# Patient Record
Sex: Female | Born: 1935 | Race: Black or African American | Hispanic: No | Marital: Married | Smoking: Former smoker
Health system: Southern US, Community
[De-identification: ages and names within clinical notes are randomized; demographics above are authoritative.]

## PROBLEM LIST (undated history)

## (undated) DIAGNOSIS — N183 Chronic kidney disease, stage 3 unspecified: Secondary | ICD-10-CM

## (undated) DIAGNOSIS — D649 Anemia, unspecified: Secondary | ICD-10-CM

## (undated) DIAGNOSIS — K3189 Other diseases of stomach and duodenum: Secondary | ICD-10-CM

## (undated) DIAGNOSIS — M1990 Unspecified osteoarthritis, unspecified site: Secondary | ICD-10-CM

## (undated) DIAGNOSIS — C801 Malignant (primary) neoplasm, unspecified: Secondary | ICD-10-CM

## (undated) DIAGNOSIS — H269 Unspecified cataract: Secondary | ICD-10-CM

## (undated) DIAGNOSIS — I10 Essential (primary) hypertension: Secondary | ICD-10-CM

## (undated) DIAGNOSIS — E119 Type 2 diabetes mellitus without complications: Secondary | ICD-10-CM

## (undated) DIAGNOSIS — N289 Disorder of kidney and ureter, unspecified: Secondary | ICD-10-CM

## (undated) HISTORY — DX: Type 2 diabetes mellitus without complications: E11.9

## (undated) HISTORY — DX: Unspecified cataract: H26.9

## (undated) HISTORY — DX: Essential (primary) hypertension: I10

## (undated) HISTORY — DX: Disorder of kidney and ureter, unspecified: N28.9

## (undated) HISTORY — PX: ENDOMETRIAL BIOPSY: SHX622

## (undated) HISTORY — PX: OTHER SURGICAL HISTORY: SHX169

---

## 2014-01-09 HISTORY — PX: EYE SURGERY: SHX253

## 2016-12-04 ENCOUNTER — Ambulatory Visit: Payer: Medicare Other | Attending: Gynecologic Oncology | Admitting: Gynecologic Oncology

## 2016-12-04 ENCOUNTER — Encounter: Payer: Self-pay | Admitting: Gynecologic Oncology

## 2016-12-04 ENCOUNTER — Telehealth: Payer: Self-pay | Admitting: *Deleted

## 2016-12-04 ENCOUNTER — Ambulatory Visit (HOSPITAL_BASED_OUTPATIENT_CLINIC_OR_DEPARTMENT_OTHER): Payer: Medicare Other

## 2016-12-04 VITALS — BP 179/69 | HR 92 | Temp 98.4°F | Resp 20 | Ht 65.35 in | Wt 167.6 lb

## 2016-12-04 DIAGNOSIS — E1122 Type 2 diabetes mellitus with diabetic chronic kidney disease: Secondary | ICD-10-CM

## 2016-12-04 DIAGNOSIS — Z794 Long term (current) use of insulin: Secondary | ICD-10-CM | POA: Diagnosis not present

## 2016-12-04 DIAGNOSIS — K869 Disease of pancreas, unspecified: Secondary | ICD-10-CM | POA: Diagnosis present

## 2016-12-04 DIAGNOSIS — Z87891 Personal history of nicotine dependence: Secondary | ICD-10-CM | POA: Diagnosis not present

## 2016-12-04 DIAGNOSIS — R7989 Other specified abnormal findings of blood chemistry: Secondary | ICD-10-CM | POA: Diagnosis not present

## 2016-12-04 DIAGNOSIS — D689 Coagulation defect, unspecified: Secondary | ICD-10-CM | POA: Diagnosis not present

## 2016-12-04 DIAGNOSIS — E1121 Type 2 diabetes mellitus with diabetic nephropathy: Secondary | ICD-10-CM | POA: Insufficient documentation

## 2016-12-04 DIAGNOSIS — C541 Malignant neoplasm of endometrium: Secondary | ICD-10-CM | POA: Diagnosis not present

## 2016-12-04 DIAGNOSIS — R945 Abnormal results of liver function studies: Secondary | ICD-10-CM

## 2016-12-04 DIAGNOSIS — N289 Disorder of kidney and ureter, unspecified: Secondary | ICD-10-CM | POA: Diagnosis not present

## 2016-12-04 DIAGNOSIS — C55 Malignant neoplasm of uterus, part unspecified: Secondary | ICD-10-CM | POA: Insufficient documentation

## 2016-12-04 DIAGNOSIS — N183 Chronic kidney disease, stage 3 unspecified: Secondary | ICD-10-CM

## 2016-12-04 DIAGNOSIS — R978 Other abnormal tumor markers: Secondary | ICD-10-CM

## 2016-12-04 DIAGNOSIS — R1909 Other intra-abdominal and pelvic swelling, mass and lump: Secondary | ICD-10-CM

## 2016-12-04 DIAGNOSIS — K8689 Other specified diseases of pancreas: Secondary | ICD-10-CM

## 2016-12-04 DIAGNOSIS — I10 Essential (primary) hypertension: Secondary | ICD-10-CM | POA: Diagnosis not present

## 2016-12-04 DIAGNOSIS — Z79899 Other long term (current) drug therapy: Secondary | ICD-10-CM | POA: Diagnosis not present

## 2016-12-04 DIAGNOSIS — K259 Gastric ulcer, unspecified as acute or chronic, without hemorrhage or perforation: Secondary | ICD-10-CM | POA: Insufficient documentation

## 2016-12-04 LAB — CBC & DIFF AND RETIC
BASO%: 0.8 % (ref 0.0–2.0)
Basophils Absolute: 0.1 10*3/uL (ref 0.0–0.1)
EOS%: 2.4 % (ref 0.0–7.0)
Eosinophils Absolute: 0.2 10*3/uL (ref 0.0–0.5)
HCT: 31.1 % — ABNORMAL LOW (ref 34.8–46.6)
HGB: 9.8 g/dL — ABNORMAL LOW (ref 11.6–15.9)
Immature Retic Fract: 8.7 % (ref 1.60–10.00)
LYMPH#: 2.1 10*3/uL (ref 0.9–3.3)
LYMPH%: 30 % (ref 14.0–49.7)
MCH: 27.7 pg (ref 25.1–34.0)
MCHC: 31.5 g/dL (ref 31.5–36.0)
MCV: 87.9 fL (ref 79.5–101.0)
MONO#: 0.7 10*3/uL (ref 0.1–0.9)
MONO%: 9.3 % (ref 0.0–14.0)
NEUT#: 4.1 10*3/uL (ref 1.5–6.5)
NEUT%: 57.5 % (ref 38.4–76.8)
PLATELETS: 393 10*3/uL (ref 145–400)
RBC: 3.54 10*6/uL — AB (ref 3.70–5.45)
RDW: 14.2 % (ref 11.2–14.5)
Retic %: 1.41 % (ref 0.70–2.10)
Retic Ct Abs: 49.91 10*3/uL (ref 33.70–90.70)
WBC: 7.1 10*3/uL (ref 3.9–10.3)

## 2016-12-04 LAB — COMPREHENSIVE METABOLIC PANEL
ALBUMIN: 3.1 g/dL — AB (ref 3.5–5.0)
ALK PHOS: 103 U/L (ref 40–150)
ALT: 12 U/L (ref 0–55)
AST: 17 U/L (ref 5–34)
Anion Gap: 10 mEq/L (ref 3–11)
BUN: 21 mg/dL (ref 7.0–26.0)
CO2: 24 mEq/L (ref 22–29)
Calcium: 10 mg/dL (ref 8.4–10.4)
Chloride: 106 mEq/L (ref 98–109)
Creatinine: 1.7 mg/dL — ABNORMAL HIGH (ref 0.6–1.1)
EGFR: 33 mL/min/{1.73_m2} — AB (ref >60)
GLUCOSE: 173 mg/dL — AB (ref 70–140)
Potassium: 4.6 mEq/L (ref 3.5–5.1)
SODIUM: 140 meq/L (ref 136–145)
TOTAL PROTEIN: 8 g/dL (ref 6.4–8.3)

## 2016-12-04 LAB — PROTIME-INR
INR: 1 — AB (ref 2.00–3.50)
Protime: 12 Seconds (ref 10.6–13.4)

## 2016-12-04 LAB — HEMOGLOBIN A1C
Est. average glucose Bld gHb Est-mCnc: 183 mg/dL
HEMOGLOBIN A1C: 8 % — AB (ref 4.8–5.6)

## 2016-12-04 NOTE — Telephone Encounter (Signed)
Attempted to contact the patient, but was unable to reach her. The phone rang with no answer. Attempted to contact the patient's son, left a message to call the office back. Patient needs to be given appt with Dr. Barry Dienes

## 2016-12-04 NOTE — Progress Notes (Signed)
New Patient Note: Gyn-Onc  New Patient evaluation of Martha Mcdaniel 83 y.o. female  CC:  Chief Complaint  Patient presents with  . Carcinosarcoma of endometrium [facility])    Assessment/Plan:  Martha. Martha Mcdaniel  is a 83 y.o.  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  She has poorly controlled diabetes mellitus with associated nephropathy and neuropathy. She has unexplained elevated LFT's and coag dysfunction.  Her CT scan report does not show apparent disseminated disease.  I discussed with the patient and her son and daughter that she requires surgery for her uterine cancer. I would recommend an ex lap, TAH, BSO, lymphadenectomy because the uterus is too large to be extracted via a minimally invasive route (though if attempted minimally invasively she would need a minilap for specimen delivery).  She also requires an exploration of the upper abdomen and possible distal pancreatectomy, partial gastrectomy. I discussed that we would facilitate referral to Dr Barry Dienes from [facility] and coordinate a surgical time for [place] to remove these masses.  In the meantime we will order upper abdominal imaging to better characterize the upper abdominal mass. We will submit her films from [place] for [facility] review and to be available for surgical planning. We will submit her pathology slides for [facility] pathology review.  With respect to her underlying medical comorbidities, particularly poorly controlled DM, we will repeat labs today including HbA1C, cbc, coags and cmet.  She has been instructed to see her PCP in [facility] for optimization as she is a poor surgical candidate for such a radical procedure at her age with poorly controlled diabetes and questionable renal and hepatic function. She may require to see an internist here at [facility] or an endocrinologist to establish  optimization.   Surgery will be coordinated with Dr Barry Dienes for the new year in order to faciliate this work up.   HPI: Martha Mcdaniel is an 83 year old P3 who is seen for a new patient consultation for uterine carcinosarcoma.  The patient is a resident of the [place] (though her daughter lives in [place], [place] where she spends half her year). The patient developed some vaginal bleeding in October, 2018 and was admitted to a hospital in [place].  CT scan of the abdomen and pelvis on 10/25/16 showed normal lung bases, normal liver and spleen. A 3.5x3.5x4.9cm soft tissue mass was seen abutting the distal pancreas and arising from the fundus of the stomach "suggestive of gastroinstinal stromal tumor".  There was a cystic 4cm lesion in the right kidney (which on separate [place] appeared benign). The Uterus was markedly enlarged with a thickened endometrium at 3.7cm.  Endometrial biopsy on10/22/18 showed uterine carcinosarcoma. Pelvic US on 10/25/16 showed a uterus measuring 13x8.5x6.7cm with an endometrium that measured 3.6cm.  EGD on 10/26/16 showed a gastric ulcer which was biopsied and found to be benign.   Lab work from that hospitalization showed a WBC elevated to 15,000, with a hemoglobin of 10.2. Platelets were normal.  The white count normalized throughout there stay. But the Hb decreased to 8.3. On 10/25/16 her PTT was elevated at 15, and INR was 1.6 (the patient denies being treated with anticoagulants). Her creatinine was 1.9 and her glucose 219.  ALT was mildly elevated at 57.  HbA1C was very elevated at 9.8 on 0/18/18. She has poorly controlled diabetes and admits to being noncompliant  with meds. Her insulin was adjusted during that hospital stay.     Interval History: She continues to have vaginal bleeding but denies upper abdominal symptoms.  Current Meds:  Outpatient Encounter Medications as of 12/04/2016  Medication Sig  . insulin detemir (LEVEMIR) 100 UNIT/ML  injection Inject 25 Units into the skin at bedtime.  . insulin NPH-regular Human (NOVOLIN 70/30) (70-30) 100 UNIT/ML injection Inject 20 Units into the skin every morning.  Marland Kitchen lisinopril (PRINIVIL,ZESTRIL) 20 MG tablet Take 20 mg by mouth daily.  . pantoprazole (PROTONIX) 40 MG tablet Take 40 mg by mouth 2 (two) times daily.  . sitaGLIPtin (JANUVIA) 50 MG tablet Take 50 mg by mouth daily.  Marland Kitchen terbinafine (LAMISIL) 250 MG tablet Take 250 mg by mouth daily.   No facility-administered encounter medications on file as of 12/04/2016.     Allergy:  Allergies  Allergen Reactions  . Ibuprofen Other (See Comments)    Pt feels disoriented with medication     Social Hx:   Social History   Socioeconomic History  . Marital status: Married    Spouse name: Not on file  . Number of children: Not on file  . Years of education: Not on file  . Highest education level: Not on file  Social Needs  . Financial resource strain: Not on file  . Food insecurity - worry: Not on file  . Food insecurity - inability: Not on file  . Transportation needs - medical: Not on file  . Transportation needs - non-medical: Not on file  Occupational History  . Not on file  Tobacco Use  . Smoking status: Former Smoker    Packs/day: 1.00    Types: Cigarettes  . Smokeless tobacco: Never Used  Substance and Sexual Activity  . Alcohol use: No    Frequency: Never    Comment: Sober quit drinking  . Drug use: No  . Sexual activity: Not on file  Other Topics Concern  . Not on file  Social History Narrative  . Not on file    Past Surgical Hx: History reviewed. No pertinent surgical history.  Past Medical Hx:  Past Medical History:  Diagnosis Date  . Cataract   . Diabetes mellitus without complication ([place])   . Hypertension   . Renal insufficiency     Past Gynecological History:  SVD x 3 No LMP recorded.  Family Hx: History reviewed. No pertinent family history.  Review of Systems:  Constitutional   Feels well,    ENT Normal appearing ears and nares bilaterally Skin/Breast  No rash, sores, jaundice, itching, dryness Cardiovascular  No chest pain, shortness of breath, or edema  Pulmonary  No cough or wheeze.  Gastro Intestinal  No nausea, vomitting, or diarrhoea. No bright red blood per rectum, no abdominal pain, change in bowel movement, or constipation.  Genito Urinary  No frequency, urgency, dysuria, + vaginal bleeding Musculo Skeletal  No myalgia, arthralgia, joint swelling or pain  Neurologic  No weakness, numbness, change in gait,  Psychology  No depression, anxiety, insomnia.   Vitals:  Blood pressure (!) 179/69, pulse 92, temperature 98.4 F (36.9 C), temperature source Oral, resp. rate 20, height 5' 5.35" (1.66 m), weight 167 lb 9.6 oz (76 kg), SpO2 100 %.  Physical Exam: WD in NAD Neck  Supple NROM, without any enlargements.  Lymph Node Survey No cervical supraclavicular or inguinal adenopathy Cardiovascular  Pulse normal rate, regularity and rhythm. S1 and S2 normal.  Lungs  Clear to auscultation bilateraly, without  wheezes/crackles/rhonchi. Good air movement.  Skin  No rash/lesions/breakdown  Psychiatry  Alert and oriented to person, place, and time  Abdomen  Normoactive bowel sounds, abdomen soft, non-tender and obese without evidence of hernia.  Back No CVA tenderness Genito Urinary  Vulva/vagina: Normal external female genitalia.  No lesions. No discharge or bleeding.  Bladder/urethra:  No lesions or masses, well supported bladder  Vagina: large necrotic tumor (6cm) protruding and prolapsing through dilated cervix into vagina.  Cervix: dilated, soft, with prolapsing tumor.  Uterus: bulky, mobile   Adnexa: no discrete masses. Rectal  Good tone, no masses no cul de sac nodularity.  Extremities  No bilateral cyanosis, clubbing or edema.   [name], MD  12/04/2016, 5:16 PM

## 2016-12-04 NOTE — Patient Instructions (Addendum)
Plan on having lab work today.  We will also arrange for you to meet with Dr. Barry Dienes with [facility].  The plan will be for a joint procedure with Dr. Barry Dienes and Dr. Denman George in the new year.  With Dr. Denman George, you would be having an exploratory laparotomy, total abdominal hysterectomy, bilateral salpingo-oophorectomy, lymphadenectomy.  We will see you in the office prior to the procedure to discuss procedure in detail.  Plan on making an appointment to meet with your primary care provider about obtaining medical clearance to proceed with surgery.  If needed, we can arrange for you to meet with an endocrinologist in [place].

## 2016-12-05 ENCOUNTER — Telehealth: Payer: Self-pay | Admitting: *Deleted

## 2016-12-05 LAB — CA 125: CANCER ANTIGEN (CA) 125: 82.1 U/mL — AB (ref 0.0–38.1)

## 2016-12-05 NOTE — Telephone Encounter (Signed)
Son returned call and was given the date/time for the appt with Dr.Byerly on Dec 10th at 10:45am; arrive at 10:15am. Gave the address and phone number

## 2016-12-19 ENCOUNTER — Other Ambulatory Visit: Payer: Self-pay | Admitting: General Surgery

## 2016-12-25 ENCOUNTER — Telehealth: Payer: Self-pay | Admitting: Gynecologic Oncology

## 2016-12-25 NOTE — Telephone Encounter (Signed)
Called and spoke with referrals coordinator at [facility].  Following up on Dr. Marlowe Aschoff note stating that the patient would be referred to GI.  Referrals coordinator stated she would follow up and they would place the referral.

## 2016-12-26 ENCOUNTER — Other Ambulatory Visit: Payer: Self-pay

## 2016-12-26 ENCOUNTER — Telehealth: Payer: Self-pay

## 2016-12-26 DIAGNOSIS — R1902 Left upper quadrant abdominal swelling, mass and lump: Secondary | ICD-10-CM

## 2016-12-26 NOTE — Telephone Encounter (Signed)
01/04/17 EUS 115 pm message left with [facility]. EUS scheduled, pt instructed and medications reviewed.  Patient instructions mailed to home.  Patient to call with any questions or concerns.

## 2016-12-26 NOTE — Plan of Care (Signed)
83 year old female recently diagnosed with carcinosarcoma of the uterus and pancreatic mass presented with complaints of increasing vaginal bleeding at [facility] ER.  Patient was febrile but not having sepsis features.  ER physician did a CT scan of the abdomen and pelvis which showed air in the uterine cavity concerning for infection.  Patient's blood sugar was in the around 380s.  Not in DKA.  ER physician had discussed with Dr. Everitt Amber who advised hospitalist admission and they will be seeing patient in consult.  Patient was started on Invanz and Flagyl and fluids.  Gean Birchwood.

## 2016-12-27 ENCOUNTER — Inpatient Hospital Stay (HOSPITAL_COMMUNITY): Payer: Medicare Other

## 2016-12-27 ENCOUNTER — Other Ambulatory Visit: Payer: Self-pay

## 2016-12-27 ENCOUNTER — Inpatient Hospital Stay (HOSPITAL_COMMUNITY)
Admission: AD | Admit: 2016-12-27 | Discharge: 2016-12-31 | DRG: 872 | Disposition: A | Discharge status: Home / Self Care | Payer: Medicare Other | Source: Other Acute Inpatient Hospital | Attending: Nephrology | Admitting: Nephrology

## 2016-12-27 ENCOUNTER — Encounter (HOSPITAL_COMMUNITY): Payer: Self-pay | Admitting: *Deleted

## 2016-12-27 DIAGNOSIS — I129 Hypertensive chronic kidney disease with stage 1 through stage 4 chronic kidney disease, or unspecified chronic kidney disease: Secondary | ICD-10-CM | POA: Diagnosis present

## 2016-12-27 DIAGNOSIS — N183 Chronic kidney disease, stage 3 unspecified: Secondary | ICD-10-CM | POA: Diagnosis present

## 2016-12-27 DIAGNOSIS — R1909 Other intra-abdominal and pelvic swelling, mass and lump: Secondary | ICD-10-CM | POA: Diagnosis not present

## 2016-12-27 DIAGNOSIS — N939 Abnormal uterine and vaginal bleeding, unspecified: Secondary | ICD-10-CM | POA: Diagnosis present

## 2016-12-27 DIAGNOSIS — N1330 Unspecified hydronephrosis: Secondary | ICD-10-CM | POA: Diagnosis present

## 2016-12-27 DIAGNOSIS — C49A2 Gastrointestinal stromal tumor of stomach: Secondary | ICD-10-CM | POA: Diagnosis present

## 2016-12-27 DIAGNOSIS — C55 Malignant neoplasm of uterus, part unspecified: Secondary | ICD-10-CM | POA: Diagnosis present

## 2016-12-27 DIAGNOSIS — E669 Obesity, unspecified: Secondary | ICD-10-CM | POA: Diagnosis present

## 2016-12-27 DIAGNOSIS — A419 Sepsis, unspecified organism: Secondary | ICD-10-CM | POA: Diagnosis present

## 2016-12-27 DIAGNOSIS — Z6827 Body mass index (BMI) 27.0-27.9, adult: Secondary | ICD-10-CM | POA: Diagnosis not present

## 2016-12-27 DIAGNOSIS — E1165 Type 2 diabetes mellitus with hyperglycemia: Secondary | ICD-10-CM

## 2016-12-27 DIAGNOSIS — N710 Acute inflammatory disease of uterus: Secondary | ICD-10-CM | POA: Diagnosis present

## 2016-12-27 DIAGNOSIS — Z9114 Patient's other noncompliance with medication regimen: Secondary | ICD-10-CM | POA: Diagnosis not present

## 2016-12-27 DIAGNOSIS — K259 Gastric ulcer, unspecified as acute or chronic, without hemorrhage or perforation: Secondary | ICD-10-CM | POA: Diagnosis present

## 2016-12-27 DIAGNOSIS — E785 Hyperlipidemia, unspecified: Secondary | ICD-10-CM | POA: Diagnosis present

## 2016-12-27 DIAGNOSIS — Z794 Long term (current) use of insulin: Secondary | ICD-10-CM

## 2016-12-27 DIAGNOSIS — E1122 Type 2 diabetes mellitus with diabetic chronic kidney disease: Secondary | ICD-10-CM | POA: Diagnosis present

## 2016-12-27 DIAGNOSIS — K219 Gastro-esophageal reflux disease without esophagitis: Secondary | ICD-10-CM | POA: Diagnosis present

## 2016-12-27 DIAGNOSIS — D509 Iron deficiency anemia, unspecified: Secondary | ICD-10-CM | POA: Diagnosis present

## 2016-12-27 DIAGNOSIS — Z87891 Personal history of nicotine dependence: Secondary | ICD-10-CM | POA: Diagnosis not present

## 2016-12-27 DIAGNOSIS — R1900 Intra-abdominal and pelvic swelling, mass and lump, unspecified site: Secondary | ICD-10-CM

## 2016-12-27 DIAGNOSIS — R6510 Systemic inflammatory response syndrome (SIRS) of non-infectious origin without acute organ dysfunction: Secondary | ICD-10-CM | POA: Diagnosis not present

## 2016-12-27 DIAGNOSIS — E1129 Type 2 diabetes mellitus with other diabetic kidney complication: Secondary | ICD-10-CM | POA: Diagnosis present

## 2016-12-27 DIAGNOSIS — I10 Essential (primary) hypertension: Secondary | ICD-10-CM

## 2016-12-27 DIAGNOSIS — C541 Malignant neoplasm of endometrium: Secondary | ICD-10-CM | POA: Diagnosis present

## 2016-12-27 DIAGNOSIS — E1121 Type 2 diabetes mellitus with diabetic nephropathy: Secondary | ICD-10-CM | POA: Diagnosis present

## 2016-12-27 DIAGNOSIS — D638 Anemia in other chronic diseases classified elsewhere: Secondary | ICD-10-CM | POA: Diagnosis present

## 2016-12-27 HISTORY — DX: Chronic kidney disease, stage 3 unspecified: N18.30

## 2016-12-27 HISTORY — DX: Chronic kidney disease, stage 3 (moderate): N18.3

## 2016-12-27 LAB — GLUCOSE, CAPILLARY
Glucose-Capillary: 237 mg/dL — ABNORMAL HIGH (ref 65–99)
Glucose-Capillary: 190 mg/dL — ABNORMAL HIGH (ref 65–99)

## 2016-12-27 LAB — CREATININE, SERUM
CREATININE: 1.57 mg/dL — AB (ref 0.44–1.00)
GFR calc Af Amer: 35 mL/min — ABNORMAL LOW (ref >60)
GFR, EST NON AFRICAN AMERICAN: 30 mL/min — AB (ref >60)

## 2016-12-27 LAB — APTT: aPTT: 29 seconds (ref 24–36)

## 2016-12-27 LAB — PROTIME-INR
INR: 1.09
PROTHROMBIN TIME: 14.1 s (ref 11.4–15.2)

## 2016-12-27 LAB — LACTIC ACID, PLASMA
LACTIC ACID, VENOUS: 1.3 mmol/L (ref 0.5–1.9)
LACTIC ACID, VENOUS: 1.4 mmol/L (ref 0.5–1.9)

## 2016-12-27 LAB — PROCALCITONIN: PROCALCITONIN: 5.09 ng/mL

## 2016-12-27 LAB — ABO/RH: ABO/RH(D): B POS

## 2016-12-27 MED ORDER — SIMVASTATIN 20 MG PO TABS
20.0000 mg | ORAL_TABLET | Freq: Every day | ORAL | Status: DC
  Administered 2016-12-27 – 2016-12-30 (×4): 20 mg via ORAL
  Filled 2016-12-27 (×4): qty 1

## 2016-12-27 MED ORDER — INSULIN ASPART 100 UNIT/ML ~~LOC~~ SOLN
0.0000 [IU] | Freq: Three times a day (TID) | SUBCUTANEOUS | Status: DC
  Administered 2016-12-27: 3 [IU] via SUBCUTANEOUS
  Administered 2016-12-28: 2 [IU] via SUBCUTANEOUS
  Administered 2016-12-28: 1 [IU] via SUBCUTANEOUS
  Administered 2016-12-29 (×2): 2 [IU] via SUBCUTANEOUS

## 2016-12-27 MED ORDER — PANTOPRAZOLE SODIUM 40 MG PO TBEC
40.0000 mg | DELAYED_RELEASE_TABLET | Freq: Two times a day (BID) | ORAL | Status: DC
  Administered 2016-12-27 – 2016-12-31 (×8): 40 mg via ORAL
  Filled 2016-12-27 (×8): qty 1

## 2016-12-27 MED ORDER — ENOXAPARIN SODIUM 30 MG/0.3ML ~~LOC~~ SOLN
30.0000 mg | SUBCUTANEOUS | Status: DC
  Administered 2016-12-28: 30 mg via SUBCUTANEOUS
  Filled 2016-12-27: qty 0.3

## 2016-12-27 MED ORDER — DEXTROSE 5 % IV SOLN
1.0000 g | Freq: Two times a day (BID) | INTRAVENOUS | Status: DC
  Filled 2016-12-27: qty 1

## 2016-12-27 MED ORDER — OXYCODONE-ACETAMINOPHEN 5-325 MG PO TABS
1.0000 | ORAL_TABLET | ORAL | Status: DC | PRN
  Administered 2016-12-28 – 2016-12-31 (×6): 1 via ORAL
  Filled 2016-12-27 (×7): qty 1

## 2016-12-27 MED ORDER — MORPHINE SULFATE (PF) 4 MG/ML IV SOLN: 0.5000 mg | INTRAVENOUS | Status: DC | PRN

## 2016-12-27 MED ORDER — ENOXAPARIN SODIUM 40 MG/0.4ML ~~LOC~~ SOLN
40.0000 mg | SUBCUTANEOUS | Status: DC
  Administered 2016-12-27: 40 mg via SUBCUTANEOUS
  Filled 2016-12-27: qty 0.4

## 2016-12-27 MED ORDER — LISINOPRIL 20 MG PO TABS
20.0000 mg | ORAL_TABLET | Freq: Every day | ORAL | Status: DC
  Administered 2016-12-27 – 2016-12-31 (×5): 20 mg via ORAL
  Filled 2016-12-27 (×5): qty 1

## 2016-12-27 MED ORDER — SODIUM CHLORIDE 0.9 % IV BOLUS (SEPSIS)
1500.0000 mL | Freq: Once | INTRAVENOUS | Status: AC
  Administered 2016-12-27: 1500 mL via INTRAVENOUS

## 2016-12-27 MED ORDER — SODIUM CHLORIDE 0.9 % IV SOLN
INTRAVENOUS | Status: DC
  Administered 2016-12-27 – 2016-12-29 (×2): via INTRAVENOUS

## 2016-12-27 MED ORDER — ONDANSETRON HCL 4 MG/2ML IJ SOLN: 4.0000 mg | Freq: Three times a day (TID) | INTRAMUSCULAR | Status: DC | PRN

## 2016-12-27 MED ORDER — HYDRALAZINE HCL 20 MG/ML IJ SOLN: 5.0000 mg | INTRAMUSCULAR | Status: DC | PRN

## 2016-12-27 MED ORDER — PIPERACILLIN-TAZOBACTAM 3.375 G IVPB
3.3750 g | Freq: Once | INTRAVENOUS | Status: AC
  Administered 2016-12-27: 3.375 g via INTRAVENOUS
  Filled 2016-12-27: qty 50

## 2016-12-27 MED ORDER — ZOLPIDEM TARTRATE 5 MG PO TABS: 5.0000 mg | ORAL_TABLET | Freq: Every evening | ORAL | Status: DC | PRN

## 2016-12-27 MED ORDER — INSULIN DETEMIR 100 UNIT/ML ~~LOC~~ SOLN
15.0000 [IU] | Freq: Every day | SUBCUTANEOUS | Status: DC
  Administered 2016-12-27 – 2016-12-30 (×4): 15 [IU] via SUBCUTANEOUS
  Filled 2016-12-27 (×5): qty 0.15

## 2016-12-27 MED ORDER — ACETAMINOPHEN 325 MG PO TABS: 650.0000 mg | ORAL_TABLET | Freq: Four times a day (QID) | ORAL | Status: DC | PRN

## 2016-12-27 MED ORDER — PIPERACILLIN-TAZOBACTAM 3.375 G IVPB
3.3750 g | Freq: Three times a day (TID) | INTRAVENOUS | Status: DC
  Administered 2016-12-28 – 2016-12-31 (×11): 3.375 g via INTRAVENOUS
  Filled 2016-12-27 (×11): qty 50

## 2016-12-27 NOTE — Progress Notes (Signed)
Martha Cross,NP notified of pt admission to [facility] at request of Dr. Blaine Hamper. Stacey Drain

## 2016-12-27 NOTE — Consult Note (Addendum)
Consult Note: Gyn-Onc  Consult was requested by Dr. Blaine Hamper for the evaluation of Martha Mcdaniel 83 y.o. female  CC: Endometritis in setting of carcinosarcoma   Assessment/Plan:  [name]  is a 83 y.o.  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 She has poorly controlled diabetes mellitus with associated nephropathy and neuropathy.  I recommend evaluating with CT abdomen and pelvis to rule out drainable collections.  Continue broad spectrum antibiotics with gram negative and anaerobic coverage.  She requires surgery for her uterine cancer, though it would be safest for Martha Mcdaniel to have this performed when she was not actively infected as surgery in the setting of active pelvic infection is associated with high morbity and mortality. She will need an ex lap, TAH, BSO, lymphadenectomy because the uterus is too large to be extracted via a minimally invasive route (though if attempted minimally invasively she would need a minilap for specimen delivery).  She also requires an exploration of the upper abdomen and possible distal pancreatectomy, partial gastrectomy. She has seen Dr Barry Dienes from [facility] and a coordinated surgical date had been scheduled for January 29th.   In the meantime she is scheduled for endoscopic ultrasound and possible biopsy to evaluate the mass.   Her surgery needed to be delayed due to her underlying medical comorbidities, particularly poorly controlled DM. She has been instructed to see her PCP in [facility] for optimization as she is a poor surgical candidate for such a radical procedure at her age with poorly controlled diabetes and questionable renal and hepatic function. She may require to see an internist here at [facility] or an  endocrinologist to establish optimization.   HPI: [name] is an 83 year old P3 who was seen for a new patient consultation for uterine carcinosarcoma by me on 12/04/16.  The patient is a resident of the [place] (though her daughter lives in [place], [place] where she spends half her year). The patient developed some vaginal bleeding in October, 2018 and was admitted to a hospital in [place].  CT scan of the abdomen and pelvis on 10/25/16 showed normal lung bases, normal liver and spleen. A 3.5x3.5x4.9cm soft tissue mass was seen abutting the distal pancreas and arising from the fundus of the stomach "suggestive of gastroinstinal stromal tumor".  There was a cystic 4cm lesion in the right kidney (which on separate [place] appeared benign). The Uterus was markedly enlarged with a thickened endometrium at 3.7cm.  Endometrial biopsy on10/22/18 showed uterine carcinosarcoma. Pelvic US on 10/25/16 showed a uterus measuring 13x8.5x6.7cm with an endometrium that measured 3.6cm.  EGD on 10/26/16 showed a gastric ulcer which was biopsied and found to be benign.   Lab work from that hospitalization showed a WBC elevated to 15,000, with a hemoglobin of 10.2. Platelets were normal.  The white count normalized throughout there stay. But the Hb decreased to 8.3. On 10/25/16 her PTT was elevated at 15, and INR was 1.6 (the patient denies being treated with anticoagulants). Her creatinine was 1.9 and her glucose 219.  ALT was mildly elevated at 57.  HbA1C was very elevated at 9.8 on 10/26/16. She has poorly controlled diabetes and admits to being noncompliant with meds. Her insulin was adjusted during that hospital stay.  Repeat labs here at [facility] on  12/04/16 were overall improved and showed a low albumin at 3.1 and elevated creatinine at 1.7. Her Hb was low (anemia) at 9.8 - likely secondary to bleeding from the tumor and anemia of chronic disease. Her LFT's were normal, as were her  coags.  Her Hb A1 C was elevated at 8%.  Her CA 125 tumor marker was mildly elevated at 82.   Interval History: Surgery was coordinated for January, 29th, 2019 as a combined procedure with Dr Barry Dienes for resection of the upper GI tumor with a planned preoperative EUS study to better delineate the mass.  In the intervening period she developed symptoms of abdominal pain and fevers and presented to the local ER in [facility] where she was noted to have a fever of 102. IV antibiotics were started and she was transferred to [facility] approximately 30 hours later.   At the time of admission she was afebrile (70F), mildly tachycardic (105) and with otherwise normal vital signs.   Current Meds:  Current Facility-Administered Medications:  .  0.9 %  sodium chloride infusion, , Intravenous, Continuous, Ivor Costa, MD .  acetaminophen (TYLENOL) tablet 650 mg, 650 mg, Oral, Q6H PRN, Ivor Costa, MD .  cefoTEtan (CEFOTAN) 1 g in dextrose 5 % 50 mL IVPB, 1 g, Intravenous, Q12H, Ivor Costa, MD .  hydrALAZINE (APRESOLINE) injection 5 mg, 5 mg, Intravenous, Q2H PRN, Ivor Costa, MD .  insulin detemir (LEVEMIR) injection 15 Units, 15 Units, Subcutaneous, QHS, Ivor Costa, MD .  lisinopril (PRINIVIL,ZESTRIL) tablet 20 mg, 20 mg, Oral, Daily, Ivor Costa, MD .  morphine 4 MG/ML injection 0.52 mg, 0.52 mg, Intravenous, Q4H PRN, Ivor Costa, MD .  ondansetron (ZOFRAN) injection 4 mg, 4 mg, Intravenous, Q8H PRN, Ivor Costa, MD .  oxyCODONE-acetaminophen (PERCOCET/ROXICET) 5-325 MG per tablet 1 tablet, 1 tablet, Oral, Q4H PRN, Ivor Costa, MD .  pantoprazole (PROTONIX) EC tablet 40 mg, 40 mg, Oral, BID, Ivor Costa, MD .  simvastatin (ZOCOR) tablet 20 mg, 20 mg, Oral, q1800, Ivor Costa, MD .  sodium chloride 0.9 % bolus 1,500 mL, 1,500 mL, Intravenous, Once, Ivor Costa, MD .  zolpidem (AMBIEN) tablet 5 mg, 5 mg, Oral, QHS PRN, Ivor Costa, MD   Allergy:  Allergies  Allergen Reactions  . Ibuprofen  Other (See Comments)    Pt feels disoriented with medication     Social Hx:   Social History   Socioeconomic History  . Marital status: Married    Spouse name: Not on file  . Number of children: Not on file  . Years of education: Not on file  . Highest education level: Not on file  Social Needs  . Financial resource strain: Not on file  . Food insecurity - worry: Not on file  . Food insecurity - inability: Not on file  . Transportation needs - medical: Not on file  . Transportation needs - non-medical: Not on file  Occupational History  . Not on file  Tobacco Use  . Smoking status: Former Smoker    Packs/day: 1.00    Types: Cigarettes  . Smokeless tobacco: Never Used  Substance and Sexual Activity  . Alcohol use: No    Frequency: Never    Comment: Sober quit drinking  . Drug use: No  . Sexual activity: No  Other Topics Concern  . Not on file  Social History Narrative  . Not on file    Past Surgical Hx: History reviewed. No pertinent surgical history.  Past Medical Hx:  Past Medical History:  Diagnosis Date  . Cataract   . CKD (chronic kidney disease), stage III ([place])   . Diabetes mellitus without complication ([place])   . Hypertension   . Renal insufficiency     Past Gynecological History:  Carcinosarcoma of the uterus. No LMP recorded.  Family Hx: History reviewed. No pertinent family history.  Review of Systems:  Constitutional  Feels well,   ENT Normal appearing ears and nares bilaterally Skin/Breast  No rash, sores, jaundice, itching, dryness Cardiovascular  No chest pain, shortness of breath, or edema  Pulmonary  No cough or wheeze.  Gastro Intestinal  No nausea, vomitting, or diarrhoea. No bright red blood per rectum, no abdominal pain, change in bowel movement, or constipation.  Genito Urinary  No frequency, urgency, dysuria, + bleeding (stable, light) Musculo Skeletal  No myalgia, arthralgia, joint swelling or pain  Neurologic  No  weakness, numbness, change in gait,  Psychology  No depression, anxiety, insomnia.   Vitals:  Blood pressure 137/63, pulse (!) 105, temperature 99 F (37.2 C), temperature source Oral, resp. rate 19, height 5\' 6"  (1.676 m), weight 169 lb 12.1 oz (77 kg).  Physical Exam: WD in NAD Neck  Supple NROM, without any enlargements.  Lymph Node Survey No cervical supraclavicular or inguinal adenopathy Cardiovascular  Pulse normal rate, regularity and rhythm. S1 and S2 normal.  Lungs  Clear to auscultation bilateraly, without wheezes/crackles/rhonchi. Good air movement.  Skin  No rash/lesions/breakdown  Psychiatry  Alert and oriented to person, place, and time  Abdomen  Normoactive bowel sounds, abdomen soft, snd obese without evidence of hernia. Tender uterine fundus to palpate. Back No CVA tenderness Genito Urinary  Vulva/vagina: Normal external female genitalia.  No lesions. No discharge or bleeding.  Bladder/urethra:  No lesions or masses, well supported bladder  Vagina: normal, no lesions, blood (brown) in vault  Cervix: dilated around 6cm uterine prolapsing mass  Uterus: Globularly enlarged to umbilicus, tender  Rectal  deferred.  Extremities  No bilateral cyanosis, clubbing or edema.  Plan discussed with Dr Blaine Hamper. Attempted to contact patient's daughter, however patient unaware of her number and not listed in [place].  [name], MD  12/27/2016, 5:30 PM  Cell: [phone]

## 2016-12-27 NOTE — H&P (Addendum)
History and Physical    Martha Mcdaniel XTG:[id number] DOB: Apr 06, 1933 DOA: 12/27/2016  Referring MD/NP/PA:   PCP: System, Pcp Not In   Patient coming from:  The patient is coming from home.  At baseline, pt is independent for most of ADL.  Chief Complaint: Abdominal pain, vaginal bleeding, fever and chills.  HPI: Martha Mcdaniel is a 83 y.o. female with medical history significant of hypertension, hyperlipidemia, diabetes mellitus, GERD, CKD-3, who presents with abdominal pain, vaginal bleeding fever and chills.  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 She continues to have lower abdominal pain, which is constant, moderate, nonradiating.  He developed fever and chills in the past 3 days. Patient was admitted to [facility] initially, and was concerning for endometrial infection. She is transferred to [facility] for further evaluation and treatment.  Currently patient has low abdominal pain, but denies nausea vomiting or diarrhea.  Also denies chest pain, shortness breath, symptoms of UTI or unilateral weakness.  Patient was a started with 2 antibiotics, Invanz and Flagyl.   Pt was found to have WBC 14.5, stable renal function, temperature 99, tachycardia, lactic acid 1.2, INR 1.21, negative troponin, negative urinalysis, CK 86.  Patient is admitted to telemetry bed as inpatient.  OB/GYN, Dr. Everitt Amber was consulted.  Review of Systems:   General: has fevers, chills,  has poor appetite, has fatigue HEENT: no blurry vision, hearing changes or sore throat Respiratory: no dyspnea, coughing, wheezing CV: no chest pain, no palpitations GI: no nausea, vomiting, has abdominal pain, no diarrhea, constipation GU: no dysuria, burning on urination, increased urinary frequency, hematuria  Ext: no leg edema Neuro: no unilateral weakness, numbness, or tingling, no vision change or hearing  loss Skin: no rash, no skin tear. MSK: No muscle spasm, no deformity, no limitation of range of movement in spin Heme: No easy bruising.  Travel history: No recent long distant travel.  Allergy:  Allergies  Allergen Reactions  . Ibuprofen Other (See Comments)    Pt feels disoriented with medication     Past Medical History:  Diagnosis Date  . Cataract   . CKD (chronic kidney disease), stage III ([place])   . Diabetes mellitus without complication ([place])   . Hypertension   . Renal insufficiency     Past Surgical History:  Procedure Laterality Date  . ENDOMETRIAL BIOPSY    . Gastric ulcer biopsy      Social History:  reports that she has quit smoking. Her smoking use included cigarettes. She smoked 1.00 pack per day. she has never used smokeless tobacco. She reports that she does not drink alcohol or use drugs.  Family History:  Family History  Problem Relation Age of Onset  . Headache Mother      Prior to Admission medications   Medication Sig Start Date End Date Taking? Authorizing Provider  acetaminophen (TYLENOL) 325 MG tablet Take 650 mg by mouth every 6 (six) hours as needed for mild pain or moderate pain.   Yes [provider]  atorvastatin (LIPITOR) 20 MG tablet Take 20 mg by mouth daily. 05/24/16  Yes [provider]  diclofenac sodium (VOLTAREN) 1 % GEL Apply 1 application topically 4 (four) times daily as needed for pain. 02/23/16  Yes [provider]  insulin detemir (LEVEMIR) 100 UNIT/ML injection Inject 20 Units into the skin at bedtime.    Yes [provider]  insulin NPH-regular Human (NOVOLIN  70/30) (70-30) 100 UNIT/ML injection Inject 20 Units into the skin every morning.   Yes [provider]  lisinopril (PRINIVIL,ZESTRIL) 20 MG tablet Take 20 mg by mouth daily.   Yes [provider]  simvastatin (ZOCOR) 20 MG tablet Take 20 mg by mouth daily at 6 PM.   Yes [provider]  pantoprazole (PROTONIX)  40 MG tablet Take 40 mg by mouth 2 (two) times daily.    [provider]  sitaGLIPtin (JANUVIA) 50 MG tablet Take 50 mg by mouth daily.    [provider]  terbinafine (LAMISIL) 250 MG tablet Take 250 mg by mouth daily.    [provider]    Physical Exam: Vitals:   12/27/16 1350  BP: 137/63  Pulse: (!) 105  Resp: 19  Temp: 99 F (37.2 C)  TempSrc: Oral  Weight: 77 kg (169 lb 12.1 oz)  Height: 5\' 6"  (1.676 m)   General: Not in acute distress HEENT:       Eyes: PERRL, EOMI, no scleral icterus.       ENT: No discharge from the ears and nose, no pharynx injection, no tonsillar enlargement.        Neck: No JVD, no bruit, no mass felt. Heme: No neck lymph node enlargement. Cardiac: S1/S2, RRR, No murmurs, No gallops or rubs. Respiratory: No rales, wheezing, rhonchi or rubs. GI: Soft, has tenderness in lower abdomen, no rebound pain, no organomegaly, BS present. GU: No hematuria Ext: No pitting leg edema bilaterally. 2+DP/PT pulse bilaterally. Musculoskeletal: No joint deformities, No joint redness or warmth, no limitation of ROM in spin. Skin: No rashes.  Neuro: Alert, oriented X3, cranial nerves II-XII grossly intact, moves all extremities normally. Psych: Patient is not psychotic, no suicidal or hemocidal ideation.  Labs on Admission: I have personally reviewed following labs and imaging studies  CBC: No results for input(s): WBC, NEUTROABS, HGB, HCT, MCV, PLT in the last 168 hours. Basic Metabolic Panel: No results for input(s): NA, K, CL, CO2, GLUCOSE, BUN, CREATININE, CALCIUM, MG, PHOS in the last 168 hours. GFR: CrCl cannot be calculated (Patient's most recent lab result is older than the maximum 21 days allowed.). Liver Function Tests: No results for input(s): AST, ALT, ALKPHOS, BILITOT, PROT, ALBUMIN in the last 168 hours. No results for input(s): LIPASE, AMYLASE in the last 168 hours. No results for input(s): AMMONIA in the last 168  hours. Coagulation Profile: Recent Labs  Lab 12/27/16 1710  INR 1.09   Cardiac Enzymes: No results for input(s): CKTOTAL, CKMB, CKMBINDEX, TROPONINI in the last 168 hours. BNP (last 3 results) No results for input(s): PROBNP in the last 8760 hours. HbA1C: No results for input(s): HGBA1C in the last 72 hours. CBG: No results for input(s): GLUCAP in the last 168 hours. Lipid Profile: No results for input(s): CHOL, HDL, LDLCALC, TRIG, CHOLHDL, LDLDIRECT in the last 72 hours. Thyroid Function Tests: No results for input(s): TSH, T4TOTAL, FREET4, T3FREE, THYROIDAB in the last 72 hours. Anemia Panel: No results for input(s): VITAMINB12, FOLATE, FERRITIN, TIBC, IRON, RETICCTPCT in the last 72 hours. Urine analysis: No results found for: COLORURINE, APPEARANCEUR, LABSPEC, PHURINE, GLUCOSEU, HGBUR, BILIRUBINUR, KETONESUR, PROTEINUR, UROBILINOGEN, NITRITE, LEUKOCYTESUR Sepsis Labs: @LABRCNTIP (procalcitonin:4,lacticidven:4) )No results found for this or any previous visit (from the past 240 hour(s)).   Radiological Exams on Admission: No results found.   EKG:  Not done in ED, will get one.   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  Dr. Everitt Amber of Ob/Gyn recommended to start patient with Zosyn.  Patient meets criteria for sepsis with leukocytosis and tachycardia.  Lactic acid is pending  -will admit to tele bed as inpt -highly appreciated Dr. Serita Grit consultation. -IV zosybn -F/u Bx -will get Procalcitonin and trend lactic acid levels per sepsis  protocol. -IVF: 1.5L of NS bolus in ED, followed by 75 cc/h   Other intra-abdominal and pelvic swelling, mass and lump: per Dr. 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 She recommended CT abdomen and pelvis to rule out drainable collections. Per Dr. Denman George, need to control her infection before any surgery. Per Dr. Denman George, "she will need an ex lap, TAH, BSO, lymphadenectomy because the uterus is too large to be extracted via a minimally invasive route (though if attempted minimally invasively she would need a minilap for specimen delivery). She also requires an exploration of the upper abdomen and possible distal pancreatectomy, partial gastrectomy. She has seen Dr Barry Dienes from [facility] and a coordinated surgical date had been scheduled for January 29th".  -prn Zosyn for nausea, morphine and percoct for pain  HTN: -Lisinopril -IV hydralazine as needed  Type II diabetes mellitus with renal manifestations ([place]): Last A1c 8.0 on 12/04/16, poorly controled. Patient is taking Levemir at home -will decrease Levemir dose from 20-15 units daily. -SSI  Anemia: Hemoglobin 8.5.  Patient has vaginal bleeding, but she is at high risk of developing DVT or PE.  Dr. Denman George recommended to start DVT prophylaxis -f/u CBC q12 -check anemia panel  wGastric ulcer and gerd: Recent gastric ulcer biopsy was benign. -Protonix  CKD-3: Renal function stable.  Baseline creatinine 1.7 on 12/04/16.  Her creatinine is 1.7, BUN 29 today. -Follow-up renal function by BMP   DVT ppx: SQ Lovenox Code Status: Full code Family Communication: None at bed side.    Disposition Plan:  Anticipate discharge back to previous home environment Consults called:  OB/Gyn,  Dr. Everitt Amber Admission status:  Inpatient/tele   Date of Service 12/27/2016    Ivor Costa [facility] Pager [phone]  If 7PM-7AM, please  contact night-coverage [email or web address] Password [email or web address] 12/27/2016, 7:26 PM

## 2016-12-27 NOTE — Progress Notes (Signed)
Pharmacy Antibiotic Note  Martha Mcdaniel is a 83 y.o. female admitted on 12/27/2016 with endometrial infection.  Pharmacy has been consulted for Zosyn dosing.  Plan: Zosyn 3.375g IV q8h (4 hour infusion time).  SCr now. Will f/u if dose needs adjustment.   Height: 5\' 6"  (167.6 cm) Weight: 169 lb 12.1 oz (77 kg) IBW/kg (Calculated) : 59.3  Temp (24hrs), Avg:99 F (37.2 C), Min:99 F (37.2 C), Max:99 F (37.2 C)  Recent Labs  Lab 12/27/16 1710  LATICACIDVEN 1.3    CrCl cannot be calculated (Patient's most recent lab result is older than the maximum 21 days allowed.).    Allergies  Allergen Reactions  . Ibuprofen Other (See Comments)    Pt feels disoriented with medication     Antimicrobials this admission: 12/19 Zosyn >>  Dose adjustments this admission:  Microbiology results: 12/19 BCx:   Thank you for allowing pharmacy to be a part of this patient's care.  Hershal Coria 12/27/2016 6:06 PM

## 2016-12-28 DIAGNOSIS — C541 Malignant neoplasm of endometrium: Secondary | ICD-10-CM

## 2016-12-28 DIAGNOSIS — N183 Chronic kidney disease, stage 3 (moderate): Secondary | ICD-10-CM

## 2016-12-28 DIAGNOSIS — N710 Acute inflammatory disease of uterus: Secondary | ICD-10-CM

## 2016-12-28 LAB — CBC WITH DIFFERENTIAL/PLATELET
BASOS ABS: 0 10*3/uL (ref 0.0–0.1)
Basophils Relative: 0 %
EOS PCT: 1 %
Eosinophils Absolute: 0.1 10*3/uL (ref 0.0–0.7)
HCT: 24 % — ABNORMAL LOW (ref 36.0–46.0)
HEMOGLOBIN: 7.8 g/dL — AB (ref 12.0–15.0)
LYMPHS ABS: 2 10*3/uL (ref 0.7–4.0)
LYMPHS PCT: 18 %
MCH: 28.1 pg (ref 26.0–34.0)
MCHC: 32.5 g/dL (ref 30.0–36.0)
MCV: 86.3 fL (ref 78.0–100.0)
MONO ABS: 1 10*3/uL (ref 0.1–1.0)
MONOS PCT: 9 %
Neutro Abs: 8.2 10*3/uL — ABNORMAL HIGH (ref 1.7–7.7)
Neutrophils Relative %: 72 %
Platelets: 398 10*3/uL (ref 150–400)
RBC: 2.78 MIL/uL — ABNORMAL LOW (ref 3.87–5.11)
RDW: 14.9 % (ref 11.5–15.5)
WBC: 11.3 10*3/uL — AB (ref 4.0–10.5)

## 2016-12-28 LAB — RETICULOCYTES
RBC.: 2.7 MIL/uL — AB (ref 3.87–5.11)
RETIC CT PCT: 0.9 % (ref 0.4–3.1)
Retic Count, Absolute: 24.3 10*3/uL (ref 19.0–186.0)

## 2016-12-28 LAB — IRON AND TIBC
Iron: 10 ug/dL — ABNORMAL LOW (ref 28–170)
SATURATION RATIOS: 7 % — AB (ref 10.4–31.8)
TIBC: 134 ug/dL — AB (ref 250–450)
UIBC: 124 ug/dL

## 2016-12-28 LAB — GLUCOSE, CAPILLARY
GLUCOSE-CAPILLARY: 123 mg/dL — AB (ref 65–99)
GLUCOSE-CAPILLARY: 158 mg/dL — AB (ref 65–99)
GLUCOSE-CAPILLARY: 144 mg/dL — AB (ref 65–99)
Glucose-Capillary: 149 mg/dL — ABNORMAL HIGH (ref 65–99)

## 2016-12-28 LAB — VITAMIN B12: Vitamin B-12: 1020 pg/mL — ABNORMAL HIGH (ref 180–914)

## 2016-12-28 LAB — BASIC METABOLIC PANEL
Anion gap: 5 (ref 5–15)
BUN: 23 mg/dL — AB (ref 6–20)
CHLORIDE: 112 mmol/L — AB (ref 101–111)
CO2: 22 mmol/L (ref 22–32)
CREATININE: 1.64 mg/dL — AB (ref 0.44–1.00)
Calcium: 8.7 mg/dL — ABNORMAL LOW (ref 8.9–10.3)
GFR calc Af Amer: 33 mL/min — ABNORMAL LOW (ref >60)
GFR, EST NON AFRICAN AMERICAN: 28 mL/min — AB (ref >60)
GLUCOSE: 156 mg/dL — AB (ref 65–99)
Potassium: 4.4 mmol/L (ref 3.5–5.1)
SODIUM: 139 mmol/L (ref 135–145)

## 2016-12-28 LAB — FERRITIN: Ferritin: 152 ng/mL (ref 11–307)

## 2016-12-28 LAB — FOLATE: FOLATE: 17.3 ng/mL (ref >5.9)

## 2016-12-28 MED ORDER — ACETAMINOPHEN 325 MG PO TABS
650.0000 mg | ORAL_TABLET | Freq: Four times a day (QID) | ORAL | Status: DC | PRN
  Administered 2016-12-29: 650 mg via ORAL
  Filled 2016-12-28: qty 2

## 2016-12-28 NOTE — Progress Notes (Signed)
PROGRESS NOTE    Martha Mcdaniel  CBJ:[id number] DOB: [birth year] DOA: 12/27/2016 PCP: System, Pcp Not In  Brief Narrative: [name]  is a 83 y.o.  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 Denman George -Continue IV Zosyn and IV fluids today -Plan for exploratory laparoscopy, TAH, BSO and lymphadenectomy along with exploration and possible distal pancreatectomy and partial gastrectomy by Dr. 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 -Plan for partial gastrectomy by surgery in a month  Type 2 diabetes Last hemoglobin A1c was 8 -Continue Levemir, sliding scale insulin  Anemia -Due to chronic blood loss from vaginal bleeding and chronic disease -Monitor hemoglobin, may need iron infusion  Chronic kidney disease stage III -Baseline creatinine around 1.7 -Monitor-  DVT prophylaxis: lovenox Code Status: Full Code Family Communication: no family at bedside   disposition : Home pending clinical improvement    Pocedures:   Antimicrobials:    Subjective: -Feels better today, intermittent mild vaginal bleeding reported, occasional lower abdominal pain which is chronic, no nausea or vomiting   Objective: Vitals:   12/27/16 1350 12/27/16 2114 12/28/16 0529  BP: 137/63 132/67 (!) 139/46  Pulse: (!) 105 (!) 105 72  Resp: 19 16 16   Temp: 99 F (37.2 C) 99.8 F (37.7 C) 99.9 F (37.7 C)  TempSrc: Oral  Oral Oral  SpO2:  99% 96%  Weight: 77 kg (169 lb 12.1 oz)    Height: 5\' 6"  (1.676 m)      Intake/Output Summary (Last 24 hours) at 12/28/2016 1259 Last data filed at 12/28/2016 1034 Gross per 24 hour  Intake 940 ml  Output 950 ml  Net -10 ml   Filed Weights   12/27/16 1350  Weight: 77 kg (169 lb 12.1 oz)    Examination:  General exElderly female chronically ill appearing female Lungs: Decreased breath sounds to bases CVS: S1 & S2 heard, RRR. No JVD, murmurs, rubs, gallops  Gastrointestinal system:abdomen is obese, soft, mildly distended, large pelvic mass noted, bowel sounds present CNS: Alert and oriented. No focal neurological deficits. Extremities: Symmetric 5 x 5 power. Skin: No rashes, lesions or ulcers Psychiatry: Judgement and insight appear normal. Mood & affect appropriate.     Data Reviewed:   CBC: Recent Labs  Lab 12/28/16 0946  WBC 11.3*  NEUTROABS 8.2*  HGB 7.8*  HCT 24.0*  MCV 86.3  PLT 160   Basic Metabolic Panel: Recent Labs  Lab 12/27/16 1916 12/28/16 0515  NA  --  139  K  --  4.4  CL  --  112*  CO2  --  22  GLUCOSE  --  156*  BUN  --  23*  CREATININE 1.57* 1.64*  CALCIUM  --  8.7*   GFR: Estimated Creatinine Clearance: 28.2 mL/min (A) (by C-G formula based on SCr of 1.64 mg/dL (H)). Liver Function Tests: No results for input(s): AST, ALT, ALKPHOS, BILITOT, PROT, ALBUMIN in the last 168 hours. No results for input(s): LIPASE, AMYLASE in the  last 168 hours. No results for input(s): AMMONIA in the last 168 hours. Coagulation Profile: Recent Labs  Lab 12/27/16 1710  INR 1.09   Cardiac Enzymes: No results for input(s): CKTOTAL, CKMB, CKMBINDEX, TROPONINI in the last 168 hours. BNP (last 3 results) No results for input(s): PROBNP in the last 8760 hours. HbA1C: No results for input(s): HGBA1C in the last 72 hours. CBG: Recent Labs  Lab 12/27/16 1729 12/27/16 2128 12/28/16 0822  GLUCAP 237* 190* 123*   Lipid Profile: No  results for input(s): CHOL, HDL, LDLCALC, TRIG, CHOLHDL, LDLDIRECT in the last 72 hours. Thyroid Function Tests: No results for input(s): TSH, T4TOTAL, FREET4, T3FREE, THYROIDAB in the last 72 hours. Anemia Panel: Recent Labs    12/28/16 0515  VITAMINB12 1,020*  FOLATE 17.3  FERRITIN 152  TIBC 134*  IRON 10*  RETICCTPCT 0.9   Urine analysis: No results found for: COLORURINE, APPEARANCEUR, LABSPEC, PHURINE, GLUCOSEU, HGBUR, BILIRUBINUR, KETONESUR, PROTEINUR, UROBILINOGEN, NITRITE, LEUKOCYTESUR Sepsis Labs: @LABRCNTIP (procalcitonin:4,lacticidven:4)  )No results found for this or any previous visit (from the past 240 hour(s)).       Radiology Studies: Ct Abdomen Pelvis Wo Contrast  Result Date: 12/27/2016 CLINICAL DATA:  Abdominal infection. Endometrial cancer, possible endometritis. Abdominal pain, fever and chills. Rule out abscess. EXAM: CT ABDOMEN AND PELVIS WITHOUT CONTRAST TECHNIQUE: Multidetector CT imaging of the abdomen and pelvis was performed following the standard protocol without IV contrast. No intravenous or enteric contrast administered at request of referring clinician. COMPARISON:  None. FINDINGS: Lower chest: Minimal left pleural thickening without frank effusion. No basilar consolidation. Hepatobiliary: Lack of IV contrast limits assessment for focal lesion. No evidence of focal lesion allowing for lack contrast. Gallbladder physiologically distended, no calcified stone. No biliary dilatation. Pancreas: No ductal dilatation or inflammation. Partially calcified mass abuts the tail of the pancreas. Spleen: Normal in size.  No focal lesion on noncontrast exam. Adrenals/Urinary Tract: Mild left adrenal thickening. No dominant adrenal nodule. Moderate hydronephrosis and proximal hydroureter to the level of the uterine mass. No definite perinephric edema. Simple cyst in the mid right kidney measures 4.1 cm. No urolithiasis. Ureters are not well-defined. Urinary bladder is  physiologically distended without wall thickening. Stomach/Bowel: Lack of enteric contrast limits assessment. No evidence of bowel obstruction, wall thickening or inflammation. Stomach physiologically distended with ingested contents. The appendix is normal. Vascular/Lymphatic: Mild aortic atherosclerosis without aneurysm. Enlarged right pelvic sidewall lymph node measures 15 mm image 61 series 2. Suspect additional prominent bilateral external iliac nodes measuring approximately 10 mm, image 62 series 2, suboptimally assessed without contrast. Enlarged left inguinal node measuring 14 mm. Subcentimeter retroperitoneal nodes, largest measuring 9 mm. Reproductive: Marked uterine enlargement this uterus measuring 12.7 x 10.5 x 20 cm. Heterogeneous low-density fills the region of the endometrial canal. Uterus is deviated to the right pelvis. There is air centrally in the uterus/ endometrium proximally extending to the endocervical canal. Anterior fundal calcification may be a uterine fibroid, however is nonspecific. Neither ovary is confidently visualized. Other: Left upper quadrant 4.4 x 3.0 cm partially calcified mass abuts the tail the pancreas image 22 series 2. Lack of enteric and IV contrast limits assessment for subtle mesenteric lesions. No evidence of intra-abdominal fluid collection or abscess. No ascites or free air. Small fat containing umbilical hernia. Musculoskeletal: No lytic or blastic osseous lesions. No acute osseous abnormality. Multilevel degenerative change in the lumbar spine. IMPRESSION: 1. Markedly enlarged uterus with heterogeneous low-density filling the region of the endometrial canal, patient with history of endometrial  cancer. Air centrally in the uterus/endometrium approximately extends to the endocervical canal and may be secondary to tumor necrosis or infection. No prior exams available for comparison. 2. No extra uterine focal fluid collection suggest abscess. 3. Partially calcified  left upper quadrant 4.4 cm mass abuts the tail the pancreas. Differential considerations include an exophytic pancreatic lesion or mesenteric metastasis. Recommend correlation with any outside imaging. Further evaluation could be considered with pancreatic protocol MRI if there are no prior exams for comparison. 4. Mild right hydronephrosis and proximal hydroureter to the level of the uterine mass which is likely partially obstructing. 5. Enlarged right pelvic sidewall and left inguinal lymph nodes likely metastatic. Additional prominent bilateral external iliac nodes, suboptimally assessed without contrast. 6.  Aortic Atherosclerosis (ICD10-I70.0). Electronically Signed   By: Jeb Levering M.D.   On: 12/27/2016 22:22        Scheduled Meds: . enoxaparin (LOVENOX) injection  30 mg Subcutaneous Q24H  . insulin aspart  0-9 Units Subcutaneous TID WC  . insulin detemir  15 Units Subcutaneous QHS  . lisinopril  20 mg Oral Daily  . pantoprazole  40 mg Oral BID  . simvastatin  20 mg Oral q1800   Continuous Infusions: . sodium chloride 75 mL/hr at 12/27/16 1808  . piperacillin-tazobactam (ZOSYN)  IV Stopped (12/28/16 0940)     LOS: 1 day    Time spent: 83min    Domenic Polite, MD [facility] Page via [email or web address], password [email or web address] After 7PM please contact night-coverage  12/28/2016, 12:59 PM

## 2016-12-28 NOTE — Consult Note (Signed)
Consult Note: Gyn-Onc  Consult was requested by Dr. Blaine Hamper for the evaluation of Martha Mcdaniel 83 y.o. female  CC: Endometritis in setting of carcinosarcoma   Assessment/Plan:  [name]  is a 83 y.o.  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 She is clinically stable.  I recommend evaluating with CT abdomen and pelvis to rule out drainable collections.  Continue broad spectrum antibiotics with gram negative and anaerobic coverage (Zosyn).  She requires surgery for her uterine cancer, though it would be safest for Martha Mcdaniel to have this performed when she was not actively infected as surgery in the setting of active pelvic infection is associated with high morbity and mortality. She will need an ex lap, TAH, BSO, lymphadenectomy because the uterus is too large to be extracted via a minimally invasive route (though if attempted minimally invasively she would need a minilap for specimen delivery).  She also requires an exploration of the upper abdomen and possible distal pancreatectomy, partial gastrectomy. She has seen Dr Barry Dienes from [facility] and a coordinated surgical date had been scheduled for January 29th.   In the meantime she is scheduled for endoscopic ultrasound and possible biopsy to evaluate the mass.   Her surgery needed to be delayed due to her underlying medical comorbidities, particularly poorly controlled DM. She has been instructed to see her PCP in [facility] for optimization as she is a poor surgical candidate for such a radical procedure at her age with poorly controlled diabetes.  Discharge planning: continue inpatient care until afebrile with normal WBC.   HPI: [name] is an 83 year old P3 who was seen for a new  patient consultation for uterine carcinosarcoma by me on 12/04/16.  The patient is a resident of the [place] (though her daughter lives in [place], [place] where she spends half her year). The patient developed some vaginal bleeding in October, 2018 and was admitted to a hospital in [place].  CT scan of the abdomen and pelvis on 10/25/16 showed normal lung bases, normal liver and spleen. A 3.5x3.5x4.9cm soft tissue mass was seen abutting the distal pancreas and arising from the fundus of the stomach "suggestive of gastroinstinal stromal tumor".  There was a cystic 4cm lesion in the right kidney (which on separate [place] appeared benign). The Uterus was markedly enlarged with a thickened endometrium at 3.7cm.  Endometrial biopsy on10/22/18 showed uterine carcinosarcoma. Pelvic US on 10/25/16 showed a uterus measuring 13x8.5x6.7cm with an endometrium that measured 3.6cm.  EGD on 10/26/16 showed a gastric ulcer which was biopsied and found to be benign.   Lab work from that hospitalization showed a WBC elevated to 15,000, with a hemoglobin of 10.2. Platelets were normal.  The white count normalized throughout there stay. But the Hb decreased to 8.3. On 10/25/16 her PTT was elevated at 15, and INR was 1.6 (the patient denies being treated with anticoagulants). Her creatinine was 1.9 and her glucose 219.  ALT was mildly elevated at 57.  HbA1C was very elevated at 9.8 on 10/26/16. She has poorly controlled diabetes and admits to being noncompliant with meds. Her insulin was adjusted during that hospital stay.  Repeat labs here at [facility] on 12/04/16 were overall improved and showed a low albumin at 3.1 and elevated creatinine at 1.7. Her  Hb was low (anemia) at 9.8 - likely secondary to bleeding from the tumor and anemia of chronic disease. Her LFT's were normal, as were her coags.  Her Hb A1 C was elevated at 8%.  Her CA 125 tumor marker was mildly elevated at 82.   Surgery was  coordinated for January, 29th, 2019 as a combined procedure with Dr Barry Dienes for resection of the upper GI tumor with a planned preoperative EUS study to better delineate the mass.  In the intervening period she developed symptoms of abdominal pain and fevers and presented to the local ER in [facility] where she was noted to have a fever of 102. IV antibiotics were started and she was transferred to [facility] approximately 30 hours later.   Interval History: The patient is doing well today and denies pain. Her vital signs are reassuring. I have ordered a CBC with diff to monitor response to therapy.    Current Meds:  Current Facility-Administered Medications:  .  0.9 %  sodium chloride infusion, , Intravenous, Continuous, Ivor Costa, MD, Last Rate: 75 mL/hr at 12/27/16 1808 .  acetaminophen (TYLENOL) tablet 650 mg, 650 mg, Oral, Q6H PRN, Ivor Costa, MD .  enoxaparin (LOVENOX) injection 30 mg, 30 mg, Subcutaneous, Q24H, Runyon, Amanda M, RPH .  hydrALAZINE (APRESOLINE) injection 5 mg, 5 mg, Intravenous, Q2H PRN, Ivor Costa, MD .  insulin aspart (novoLOG) injection 0-9 Units, 0-9 Units, Subcutaneous, TID WC, Ivor Costa, MD, 3 Units at 12/27/16 1803 .  insulin detemir (LEVEMIR) injection 15 Units, 15 Units, Subcutaneous, QHS, Ivor Costa, MD, 15 Units at 12/27/16 2129 .  lisinopril (PRINIVIL,ZESTRIL) tablet 20 mg, 20 mg, Oral, Daily, Ivor Costa, MD, 20 mg at 12/27/16 2128 .  morphine 4 MG/ML injection 0.52 mg, 0.52 mg, Intravenous, Q4H PRN, Ivor Costa, MD .  ondansetron (ZOFRAN) injection 4 mg, 4 mg, Intravenous, Q8H PRN, Ivor Costa, MD .  oxyCODONE-acetaminophen (PERCOCET/ROXICET) 5-325 MG per tablet 1 tablet, 1 tablet, Oral, Q4H PRN, Ivor Costa, MD .  pantoprazole (PROTONIX) EC tablet 40 mg, 40 mg, Oral, BID, Ivor Costa, MD, 40 mg at 12/27/16 2129 .  piperacillin-tazobactam (ZOSYN) IVPB 3.375 g, 3.375 g, Intravenous, Q8H, Dara Hoyer, RPH, Last Rate: 12.5 mL/hr at 12/28/16 0248,  3.375 g at 12/28/16 0248 .  simvastatin (ZOCOR) tablet 20 mg, 20 mg, Oral, q1800, Ivor Costa, MD, 20 mg at 12/27/16 1803 .  zolpidem (AMBIEN) tablet 5 mg, 5 mg, Oral, QHS PRN, Ivor Costa, MD   Allergy:  Allergies  Allergen Reactions  . Ibuprofen Other (See Comments)    Pt feels disoriented with medication     Social Hx:   Social History   Socioeconomic History  . Marital status: Married    Spouse name: Not on file  . Number of children: Not on file  . Years of education: Not on file  . Highest education level: Not on file  Social Needs  . Financial resource strain: Not on file  . Food insecurity - worry: Not on file  . Food insecurity - inability: Not on file  . Transportation needs - medical: Not on file  . Transportation needs - non-medical: Not on file  Occupational History  . Not on file  Tobacco Use  . Smoking status: Former Smoker    Packs/day: 1.00    Types: Cigarettes  . Smokeless tobacco: Never Used  Substance and Sexual Activity  . Alcohol use: No    Frequency: Never    Comment: Sober  quit drinking  . Drug use: No  . Sexual activity: No  Other Topics Concern  . Not on file  Social History Narrative  . Not on file    Past Surgical Hx:  Past Surgical History:  Procedure Laterality Date  . ENDOMETRIAL BIOPSY    . Gastric ulcer biopsy      Past Medical Hx:  Past Medical History:  Diagnosis Date  . Cataract   . CKD (chronic kidney disease), stage III ([place])   . Diabetes mellitus without complication ([place])   . Hypertension   . Renal insufficiency     Past Gynecological History:  Carcinosarcoma of the uterus. No LMP recorded. Patient is postmenopausal.  Family Hx:  Family History  Problem Relation Age of Onset  . Headache Mother     Review of Systems:  Constitutional  Feels well,   ENT Normal appearing ears and nares bilaterally Skin/Breast  No rash, sores, jaundice, itching, dryness Cardiovascular  No chest pain, shortness of breath,  or edema  Pulmonary  No cough or wheeze.  Gastro Intestinal  No nausea, vomitting, or diarrhoea. No bright red blood per rectum, no abdominal pain, change in bowel movement, or constipation.  Genito Urinary  No frequency, urgency, dysuria, + bleeding (stable, light) Musculo Skeletal  No myalgia, arthralgia, joint swelling or pain  Neurologic  No weakness, numbness, change in gait,  Psychology  No depression, anxiety, insomnia.   Vitals:  Blood pressure (!) 139/46, pulse 72, temperature 99.9 F (37.7 C), temperature source Oral, resp. rate 16, height 5\' 6"  (1.676 m), weight 169 lb 12.1 oz (77 kg), SpO2 96 %.  Physical Exam: WD in NAD Neck  Supple NROM, without any enlargements.  Lymph Node Survey No cervical supraclavicular or inguinal adenopathy Cardiovascular  Pulse normal rate, regularity and rhythm. S1 and S2 normal.  Lungs  Clear to auscultation bilateraly, without wheezes/crackles/rhonchi. Good air movement.  Skin  No rash/lesions/breakdown  Psychiatry  Alert and oriented to person, place, and time  Abdomen  Normoactive bowel sounds, abdomen soft, snd obese without evidence of hernia. Tender uterine fundus to palpate. Back No CVA tenderness Genito Urinary  Vulva/vagina: Normal external female genitalia.  No lesions. No discharge or bleeding.  Bladder/urethra:  No lesions or masses, well supported bladder  Vagina: normal, no lesions, blood (brown) in vault  Cervix: dilated around 6cm uterine prolapsing mass  Uterus: Globularly enlarged to umbilicus, tender  Rectal  deferred.  Extremities  No bilateral cyanosis, clubbing or edema.  Plan discussed with Dr Blaine Hamper. Attempted to contact patient's daughter, however patient unaware of her number and not listed in [place].  [name], MD  12/28/2016, 7:27 AM  Cell: [phone]

## 2016-12-29 LAB — GLUCOSE, CAPILLARY
GLUCOSE-CAPILLARY: 170 mg/dL — AB (ref 65–99)
Glucose-Capillary: 96 mg/dL (ref 65–99)
Glucose-Capillary: 54 mg/dL — ABNORMAL LOW (ref 65–99)
Glucose-Capillary: 95 mg/dL (ref 65–99)
Glucose-Capillary: 189 mg/dL — ABNORMAL HIGH (ref 65–99)
Glucose-Capillary: 151 mg/dL — ABNORMAL HIGH (ref 65–99)

## 2016-12-29 LAB — PREPARE RBC (CROSSMATCH)

## 2016-12-29 MED ORDER — GLUCOSE 40 % PO GEL
1.0000 | Freq: Once | ORAL | Status: AC
  Administered 2016-12-29: 37.5 g via ORAL

## 2016-12-29 MED ORDER — GLUCOSE 40 % PO GEL
ORAL | Status: AC
  Administered 2016-12-29: 37.5 g via ORAL
  Filled 2016-12-29: qty 1

## 2016-12-29 MED ORDER — SODIUM CHLORIDE 0.9 % IV SOLN
510.0000 mg | Freq: Once | INTRAVENOUS | Status: AC
  Administered 2016-12-29: 510 mg via INTRAVENOUS
  Filled 2016-12-29: qty 17

## 2016-12-29 MED ORDER — SODIUM CHLORIDE 0.9 % IV SOLN
Freq: Once | INTRAVENOUS | Status: AC
Start: 2016-12-29 — End: 2016-12-29
  Administered 2016-12-29: 11:00:00 via INTRAVENOUS

## 2016-12-29 NOTE — Progress Notes (Signed)
Hypoglycemic Event  CBG: 54  Treatment: 1 tube instant glucose  Symptoms: None  Follow-up CBG: Time: 95 CBG Result: 0827  Possible Reasons for Event: Unknown  Comments/MD notified:  Dr. Broadus John on rounds    Cristol Engdahl, Wayne Both

## 2016-12-29 NOTE — Progress Notes (Signed)
   12/29/16 1400  Clinical Encounter Type  Visited With Patient  Visit Type Initial  Referral From Nurse  Consult/Referral To Chaplain  Spiritual Encounters  Spiritual Needs Prayer   Responded to a SCC for information on a HCPA.  Patient was not real sure.  I got the literature and went over it with her.  She asked to leave it for her daughter to look over.  Let her know we can take care of it while she is here if she wants to complete.  Will follow as needed. Chaplain Katherene Ponto

## 2016-12-29 NOTE — Progress Notes (Signed)
PROGRESS NOTE    Martha Mcdaniel  HAL:[id number] DOB: 1933/05/26 DOA: 12/27/2016 PCP: System, Pcp Not In  Brief Narrative: Martha Mcdaniel  is a 82 y.o.  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 -Improving with antibiotics oncology following  Assessment & Plan:     Acute endometritis with sepsis in the setting of large necrotic carcinosarcoma of the uterus -CT abdomen and pelvis with large mass and findings concerning for Central necrosis -Gynecology Dr.Rossi following -Continue IV Zosyn, cut down IVF today -Plan for exploratory laparoscopy, TAH, BSO and lymphadenectomy along with exploration and possible distal pancreatectomy and partial gastrectomy by Dr. 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 -Plan for partial gastrectomy by surgery in a month  Type 2 diabetes Last hemoglobin A1c was 8 -Continue Levemir, sliding scale insulin  Anemia due to iron deficiency and chronic disease -Due to chronic blood loss from vaginal bleeding and chronic disease -Hb down to 7.8 this am, will give her 1 unit of PRBC today and iron infusion -Monitor CBC -Discontinue Lovenox  Chronic kidney disease stage III -Baseline creatinine around 1.7 -Stable, monitor  DVT prophylaxis:  DC lovenox and add SCDs due to ongoing vaginal bleeding Code Status: Full Code Family Communication: no family at bedside   disposition : Home pending clinical improvement, 24-48 hours likely   Pocedures:   Antimicrobials:    Subjective: -Starting to feel better, no fevers, pelvic/lower abdominal discomfort is better, continues to have chronic vaginal  bleeding  Objective: Vitals:   12/28/16 1550 12/28/16 1552 12/28/16 2101 12/29/16 1024  BP: (!) 131/52 (!) 131/52 (!) 155/60 92/77  Pulse: 95 95 95 95  Resp: 17 17 16  (!) 24  Temp: 99.6 F (37.6 C) 99.6 F (37.6 C) 99.5 F (37.5 C) 98.5 F (36.9 C)  TempSrc: Oral Oral Oral Oral  SpO2: 100% 98% 98% 98%  Weight:      Height:        Intake/Output Summary (Last 24 hours) at 12/29/2016 1044 Last data filed at 12/29/2016 0600 Gross per 24 hour  Intake 1950 ml  Output 100 ml  Net 1850 ml   Filed Weights   12/27/16 1350  Weight: 77 kg (169 lb 12.1 oz)    Examination:  Gen: Elderly chronically ill appearing female, no distress HEENT: PERRLA, Neck supple, no JVD Lungs: Diminished breath sounds CVS: RRR,No Gallops,Rubs or new Murmurs Abd: Soft obese nontender, with large pelvic mass palpable anteriorly Extremities: No Cyanosis, Clubbing or edema Skin: no new rashes Psychiatry: Judgement and insight appear normal. Mood & affect appropriate.     Data Reviewed:   CBC: Recent Labs  Lab 12/28/16 0946  WBC 11.3*  NEUTROABS 8.2*  HGB 7.8*  HCT 24.0*  MCV 86.3  PLT 735   Basic Metabolic Panel: Recent Labs  Lab 12/27/16 1916 12/28/16 0515  NA  --  139  K  --  4.4  CL  --  112*  CO2  --  22  GLUCOSE  --  156*  BUN  --  23*  CREATININE 1.57* 1.64*  CALCIUM  --  8.7*   GFR: Estimated Creatinine Clearance: 28.2 mL/min (A) (by C-G formula based on SCr of 1.64 mg/dL (  H)). Liver Function Tests: No results for input(s): AST, ALT, ALKPHOS, BILITOT, PROT, ALBUMIN in the last 168 hours. No results for input(s): LIPASE, AMYLASE in the last 168 hours. No results for input(s): AMMONIA in the last 168 hours. Coagulation Profile: Recent Labs  Lab 12/27/16 1710  INR 1.09   Cardiac Enzymes: No results for input(s): CKTOTAL, CKMB, CKMBINDEX, TROPONINI in the last 168 hours. BNP (last 3 results) No results for input(s): PROBNP in the last 8760 hours. HbA1C: No results  for input(s): HGBA1C in the last 72 hours. CBG: Recent Labs  Lab 12/28/16 1635 12/28/16 2043 12/29/16 0429 12/29/16 0754 12/29/16 0827  GLUCAP 144* 149* 96 54* 95   Lipid Profile: No results for input(s): CHOL, HDL, LDLCALC, TRIG, CHOLHDL, LDLDIRECT in the last 72 hours. Thyroid Function Tests: No results for input(s): TSH, T4TOTAL, FREET4, T3FREE, THYROIDAB in the last 72 hours. Anemia Panel: Recent Labs    12/28/16 0515  VITAMINB12 1,020*  FOLATE 17.3  FERRITIN 152  TIBC 134*  IRON 10*  RETICCTPCT 0.9   Urine analysis: No results found for: COLORURINE, APPEARANCEUR, LABSPEC, PHURINE, GLUCOSEU, HGBUR, BILIRUBINUR, KETONESUR, PROTEINUR, UROBILINOGEN, NITRITE, LEUKOCYTESUR Sepsis Labs: @LABRCNTIP (procalcitonin:4,lacticidven:4)  ) Recent Results (from the past 240 hour(s))  Culture, blood (Routine X 2) w Reflex to ID Panel     Status: None (Preliminary result)   Collection Time: 12/27/16  4:56 PM  Result Value Ref Range Status   Specimen Description BLOOD LEFT ANTECUBITAL  Final   Special Requests   Final    BOTTLES DRAWN AEROBIC AND ANAEROBIC Blood Culture results may not be optimal due to an excessive volume of blood received in culture bottles   Culture  Setup Time PENDING  Incomplete   Culture   Final    NO GROWTH < 24 HOURS Performed at [facility], [place] [address], [place], [place] [address]    Report Status PENDING  Incomplete  Culture, blood (Routine X 2) w Reflex to ID Panel     Status: None (Preliminary result)   Collection Time: 12/27/16  5:10 PM  Result Value Ref Range Status   Specimen Description BLOOD BLOOD LEFT ARM  Final   Special Requests   Final    BOTTLES DRAWN AEROBIC AND ANAEROBIC Blood Culture results may not be optimal due to an excessive volume of blood received in culture bottles   Culture   Final    NO GROWTH < 24 HOURS Performed at [facility], [place] [address], [place], [place] [address]    Report Status PENDING  Incomplete          Radiology Studies: Ct Abdomen Pelvis Wo Contrast  Result Date: 12/27/2016 CLINICAL DATA:  Abdominal infection. Endometrial cancer, possible endometritis. Abdominal pain, fever and chills. Rule out abscess. EXAM: CT ABDOMEN AND PELVIS WITHOUT CONTRAST TECHNIQUE: Multidetector CT imaging of the abdomen and pelvis was performed following the standard protocol without IV contrast. No intravenous or enteric contrast administered at request of referring clinician. COMPARISON:  None. FINDINGS: Lower chest: Minimal left pleural thickening without frank effusion. No basilar consolidation. Hepatobiliary: Lack of IV contrast limits assessment for focal lesion. No evidence of focal lesion allowing for lack contrast. Gallbladder physiologically distended, no calcified stone. No biliary dilatation. Pancreas: No ductal dilatation or inflammation. Partially calcified mass abuts the tail of the pancreas. Spleen: Normal in size.  No focal lesion on noncontrast exam. Adrenals/Urinary Tract: Mild left adrenal thickening. No dominant adrenal nodule. Moderate hydronephrosis and proximal hydroureter to  the level of the uterine mass. No definite perinephric edema. Simple cyst in the mid right kidney measures 4.1 cm. No urolithiasis. Ureters are not well-defined. Urinary bladder is physiologically distended without wall thickening. Stomach/Bowel: Lack of enteric contrast limits assessment. No evidence of bowel obstruction, wall thickening or inflammation. Stomach physiologically distended with ingested contents. The appendix is normal. Vascular/Lymphatic: Mild aortic atherosclerosis without aneurysm. Enlarged right pelvic sidewall lymph node measures 15 mm image 61 series 2. Suspect additional prominent bilateral external iliac nodes measuring approximately 10 mm, image 62 series 2, suboptimally assessed without contrast. Enlarged left inguinal node measuring 14 mm. Subcentimeter retroperitoneal nodes, largest measuring 9  mm. Reproductive: Marked uterine enlargement this uterus measuring 12.7 x 10.5 x 20 cm. Heterogeneous low-density fills the region of the endometrial canal. Uterus is deviated to the right pelvis. There is air centrally in the uterus/ endometrium proximally extending to the endocervical canal. Anterior fundal calcification may be a uterine fibroid, however is nonspecific. Neither ovary is confidently visualized. Other: Left upper quadrant 4.4 x 3.0 cm partially calcified mass abuts the tail the pancreas image 22 series 2. Lack of enteric and IV contrast limits assessment for subtle mesenteric lesions. No evidence of intra-abdominal fluid collection or abscess. No ascites or free air. Small fat containing umbilical hernia. Musculoskeletal: No lytic or blastic osseous lesions. No acute osseous abnormality. Multilevel degenerative change in the lumbar spine. IMPRESSION: 1. Markedly enlarged uterus with heterogeneous low-density filling the region of the endometrial canal, patient with history of endometrial cancer. Air centrally in the uterus/endometrium approximately extends to the endocervical canal and may be secondary to tumor necrosis or infection. No prior exams available for comparison. 2. No extra uterine focal fluid collection suggest abscess. 3. Partially calcified left upper quadrant 4.4 cm mass abuts the tail the pancreas. Differential considerations include an exophytic pancreatic lesion or mesenteric metastasis. Recommend correlation with any outside imaging. Further evaluation could be considered with pancreatic protocol MRI if there are no prior exams for comparison. 4. Mild right hydronephrosis and proximal hydroureter to the level of the uterine mass which is likely partially obstructing. 5. Enlarged right pelvic sidewall and left inguinal lymph nodes likely metastatic. Additional prominent bilateral external iliac nodes, suboptimally assessed without contrast. 6.  Aortic Atherosclerosis  (ICD10-I70.0). Electronically Signed   By: Jeb Levering M.D.   On: 12/27/2016 22:22        Scheduled Meds: . enoxaparin (LOVENOX) injection  30 mg Subcutaneous Q24H  . insulin aspart  0-9 Units Subcutaneous TID WC  . insulin detemir  15 Units Subcutaneous QHS  . lisinopril  20 mg Oral Daily  . pantoprazole  40 mg Oral BID  . simvastatin  20 mg Oral q1800   Continuous Infusions: . sodium chloride 75 mL/hr at 12/29/16 0142  . sodium chloride    . ferumoxytol    . piperacillin-tazobactam (ZOSYN)  IV Stopped (12/29/16 0542)     LOS: 2 days    Time spent: 78min    Domenic Polite, MD [facility] Page via [email or web address], password [email or web address] After 7PM please contact night-coverage  12/29/2016, 10:44 AM

## 2016-12-30 LAB — GLUCOSE, CAPILLARY
GLUCOSE-CAPILLARY: 98 mg/dL (ref 65–99)
GLUCOSE-CAPILLARY: 104 mg/dL — AB (ref 65–99)
Glucose-Capillary: 109 mg/dL — ABNORMAL HIGH (ref 65–99)
Glucose-Capillary: 155 mg/dL — ABNORMAL HIGH (ref 65–99)

## 2016-12-30 LAB — BPAM RBC
BLOOD PRODUCT EXPIRATION DATE: 201901202359
ISSUE DATE / TIME: 201812211025
UNIT TYPE AND RH: 7300

## 2016-12-30 LAB — TYPE AND SCREEN
ABO/RH(D): B POS
Antibody Screen: NEGATIVE
Unit division: 0

## 2016-12-30 NOTE — Progress Notes (Signed)
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 -Improving with antibiotics oncology following    Assessment/Plan:  Acute endometritis with sepsis in the setting of large necrotic carcinosarcoma of the uterus. CT abdomen and pelvis with large mass and findings concerning for Central necrosis -Gynecology Dr.Rossi following. Continue IV Zosyn, blood cultures: NGTD. Will deescalate antibiotics in 24-48 hrs.  -Plan for exploratory laparoscopy, TAH, BSO and lymphadenectomy along with exploration and possible distal pancreatectomy and partial gastrectomy by Dr. 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 Plan for partial gastrectomy by surgery in a month  Type 2 diabetes. last hemoglobin A1c was 8. Continue Levemir, sliding scale insulin  Anemia due to iron deficiency and chronic disease. Due to chronic blood loss from vaginal bleeding and chronic disease. transfused  1 unit of PRBC+IV iron  on 12/21. Patient refused labs today. Recheck AM. Will need iron supplement at discharge   Chronic kidney disease stage III. Baseline creatinine around 1.7. Stable, monitor  DVT prophylaxis:  DC lovenox and add SCDs due to ongoing vaginal bleeding Code Status: Full Code Family Communication: no family at bedside   disposition : Home pending clinical improvement, likely tomorrow    Consultants:  OBGYN  Procedures:  none  Antibiotics: Antibiotics Given (last 72 hours)    Date/Time Action Medication Dose Rate   12/27/16  1808 New Bag/Given   piperacillin-tazobactam (ZOSYN) IVPB 3.375 g 3.375 g 12.5 mL/hr   12/28/16 0248 New Bag/Given   piperacillin-tazobactam (ZOSYN) IVPB 3.375 g 3.375 g 12.5 mL/hr   12/28/16 0755 New Bag/Given   piperacillin-tazobactam (ZOSYN) IVPB 3.375 g 3.375 g 12.5 mL/hr   12/28/16 1821 New Bag/Given   piperacillin-tazobactam (ZOSYN) IVPB 3.375 g 3.375 g 12.5 mL/hr   12/29/16 0142 New Bag/Given   piperacillin-tazobactam (ZOSYN) IVPB 3.375 g 3.375 g 12.5 mL/hr   12/29/16 1105 New Bag/Given   piperacillin-tazobactam (ZOSYN) IVPB 3.375 g 3.375 g 12.5 mL/hr   12/29/16 1808 New Bag/Given   piperacillin-tazobactam (ZOSYN) IVPB 3.375 g 3.375 g 12.5 mL/hr   12/30/16 0102 New Bag/Given   piperacillin-tazobactam (ZOSYN) IVPB 3.375 g 3.375 g 12.5 mL/hr   12/30/16 0850 New Bag/Given   piperacillin-tazobactam (ZOSYN) IVPB 3.375 g 3.375 g 12.5 mL/hr        (indicate start date, and stop date if known)  HPI/Subjective: Alert. No distress. Reports mild abdominal pains. No vomiting. Refused labs AM.   Objective: Vitals:   12/30/16 0018 12/30/16 0436  BP:  (!) 161/80  Pulse:  83  Resp:  20  Temp: 100.2 F (37.9 C) 97.8 F (36.6 C)  SpO2:  100%    Intake/Output Summary (Last 24 hours) at 12/30/2016 0917 Last data filed at 12/30/2016 0600 Gross per 24 hour  Intake 1230 ml  Output -  Net 1230 ml   Filed Weights   12/27/16 1350  Weight: 77 kg (169 lb 12.1 oz)    Exam:   General:  Alert. No distress   Cardiovascular: s1, 2 rrr  Respiratory: CTA BL  Abdomen: distended. Mild tender. +BS  Musculoskeletal: no leg edema  Data Reviewed: Basic Metabolic Panel: Recent Labs  Lab 12/27/16 1916 12/28/16 0515  NA  --  139  K  --  4.4  CL  --  112*  CO2  --  22  GLUCOSE  --  156*  BUN  --  23*  CREATININE 1.57* 1.64*  CALCIUM  --  8.7*   Liver Function Tests: No results for input(s): AST, ALT, ALKPHOS, BILITOT, PROT, ALBUMIN in the last 168 hours. No results for  input(s): LIPASE, AMYLASE in the last 168 hours. No results for input(s): AMMONIA in the last 168 hours. CBC: Recent Labs  Lab 12/28/16 0946  WBC 11.3*  NEUTROABS 8.2*  HGB 7.8*  HCT 24.0*  MCV 86.3  PLT 398   Cardiac Enzymes: No results for input(s): CKTOTAL, CKMB, CKMBINDEX, TROPONINI in the last 168 hours. BNP (last 3 results) No results for input(s): BNP in the last 8760 hours.  ProBNP (last 3 results) No results for input(s): PROBNP in the last 8760 hours.  CBG: Recent Labs  Lab 12/29/16 0827 12/29/16 1214 12/29/16 1642 12/29/16 2154 12/30/16 0724  GLUCAP 95 189* 151* 170* 98    Recent Results (from the past 240 hour(s))  Culture, blood (Routine X 2) w Reflex to ID Panel     Status: None (Preliminary result)   Collection Time: 12/27/16  4:56 PM  Result Value Ref Range Status   Specimen Description BLOOD LEFT ANTECUBITAL  Final   Special Requests   Final    BOTTLES DRAWN AEROBIC AND ANAEROBIC Blood Culture results may not be optimal due to an excessive volume of blood received in culture bottles   Culture  Setup Time PENDING  Incomplete   Culture   Final    NO GROWTH 2 DAYS Performed at [facility] [address], [place], [place] [address]    Report Status PENDING  Incomplete  Culture, blood (Routine X 2) w Reflex to ID Panel     Status: None (Preliminary result)   Collection Time: 12/27/16  5:10 PM  Result Value Ref Range Status   Specimen Description BLOOD BLOOD LEFT ARM  Final   Special Requests   Final    BOTTLES DRAWN AEROBIC AND ANAEROBIC Blood Culture results may not be optimal due to an excessive volume of blood received in culture bottles   Culture   Final    NO GROWTH 2 DAYS Performed at [facility], [place] [address], [place], [place] [address]    Report Status PENDING  Incomplete     Studies: No results found.  Scheduled Meds: . insulin aspart  0-9 Units Subcutaneous TID WC  . insulin detemir  15 Units Subcutaneous QHS  .  lisinopril  20 mg Oral Daily  . pantoprazole  40 mg Oral BID  . simvastatin  20 mg Oral q1800   Continuous Infusions: . sodium chloride 10 mL/hr at 12/29/16 1106  . 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 If 7PM-7AM, please contact night-coverage at [email or web address], password [facility] 12/30/2016, 9:17  AM  LOS: 3 days

## 2016-12-30 NOTE — H&P (View-Only) (Signed)
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 -Improving with antibiotics oncology following    Assessment/Plan:  Acute endometritis with sepsis in the setting of large necrotic carcinosarcoma of the uterus. CT abdomen and pelvis with large mass and findings concerning for Central necrosis -Gynecology Dr.Rossi following. Continue IV Zosyn, blood cultures: NGTD. Will deescalate antibiotics in 24-48 hrs.  -Plan for exploratory laparoscopy, TAH, BSO and lymphadenectomy along with exploration and possible distal pancreatectomy and partial gastrectomy by Dr. 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 Plan for partial gastrectomy by surgery in a month  Type 2 diabetes. last hemoglobin A1c was 8. Continue Levemir, sliding scale insulin  Anemia due to iron deficiency and chronic disease. Due to chronic blood loss from vaginal bleeding and chronic disease. transfused  1 unit of PRBC+IV iron  on 12/21. Patient refused labs today. Recheck AM. Will need iron supplement at discharge   Chronic kidney disease stage III. Baseline creatinine around 1.7. Stable, monitor  DVT prophylaxis:  DC lovenox and add SCDs due to ongoing vaginal bleeding Code Status: Full Code Family Communication: no family at bedside   disposition : Home pending clinical improvement, likely tomorrow    Consultants:  OBGYN  Procedures:  none  Antibiotics: Antibiotics Given (last 72 hours)    Date/Time Action Medication Dose Rate   12/27/16  1808 New Bag/Given   piperacillin-tazobactam (ZOSYN) IVPB 3.375 g 3.375 g 12.5 mL/hr   12/28/16 0248 New Bag/Given   piperacillin-tazobactam (ZOSYN) IVPB 3.375 g 3.375 g 12.5 mL/hr   12/28/16 0755 New Bag/Given   piperacillin-tazobactam (ZOSYN) IVPB 3.375 g 3.375 g 12.5 mL/hr   12/28/16 1821 New Bag/Given   piperacillin-tazobactam (ZOSYN) IVPB 3.375 g 3.375 g 12.5 mL/hr   12/29/16 0142 New Bag/Given   piperacillin-tazobactam (ZOSYN) IVPB 3.375 g 3.375 g 12.5 mL/hr   12/29/16 1105 New Bag/Given   piperacillin-tazobactam (ZOSYN) IVPB 3.375 g 3.375 g 12.5 mL/hr   12/29/16 1808 New Bag/Given   piperacillin-tazobactam (ZOSYN) IVPB 3.375 g 3.375 g 12.5 mL/hr   12/30/16 0102 New Bag/Given   piperacillin-tazobactam (ZOSYN) IVPB 3.375 g 3.375 g 12.5 mL/hr   12/30/16 0850 New Bag/Given   piperacillin-tazobactam (ZOSYN) IVPB 3.375 g 3.375 g 12.5 mL/hr        (indicate start date, and stop date if known)  HPI/Subjective: Alert. No distress. Reports mild abdominal pains. No vomiting. Refused labs AM.   Objective: Vitals:   12/30/16 0018 12/30/16 0436  BP:  (!) 161/80  Pulse:  83  Resp:  20  Temp: 100.2 F (37.9 C) 97.8 F (36.6 C)  SpO2:  100%    Intake/Output Summary (Last 24 hours) at 12/30/2016 0917 Last data filed at 12/30/2016 0600 Gross per 24 hour  Intake 1230 ml  Output -  Net 1230 ml   Filed Weights   12/27/16 1350  Weight: 77 kg (169 lb 12.1 oz)    Exam:   General:  Alert. No distress   Cardiovascular: s1, 2 rrr  Respiratory: CTA BL  Abdomen: distended. Mild tender. +BS  Musculoskeletal: no leg edema  Data Reviewed: Basic Metabolic Panel: Recent Labs  Lab 12/27/16 1916 12/28/16 0515  NA  --  139  K  --  4.4  CL  --  112*  CO2  --  22  GLUCOSE  --  156*  BUN  --  23*  CREATININE 1.57* 1.64*  CALCIUM  --  8.7*   Liver Function Tests: No results for input(s): AST, ALT, ALKPHOS, BILITOT, PROT, ALBUMIN in the last 168 hours. No results for  input(s): LIPASE, AMYLASE in the last 168 hours. No results for input(s): AMMONIA in the last 168 hours. CBC: Recent Labs  Lab 12/28/16 0946  WBC 11.3*  NEUTROABS 8.2*  HGB 7.8*  HCT 24.0*  MCV 86.3  PLT 398   Cardiac Enzymes: No results for input(s): CKTOTAL, CKMB, CKMBINDEX, TROPONINI in the last 168 hours. BNP (last 3 results) No results for input(s): BNP in the last 8760 hours.  ProBNP (last 3 results) No results for input(s): PROBNP in the last 8760 hours.  CBG: Recent Labs  Lab 12/29/16 0827 12/29/16 1214 12/29/16 1642 12/29/16 2154 12/30/16 0724  GLUCAP 95 189* 151* 170* 98    Recent Results (from the past 240 hour(s))  Culture, blood (Routine X 2) w Reflex to ID Panel     Status: None (Preliminary result)   Collection Time: 12/27/16  4:56 PM  Result Value Ref Range Status   Specimen Description BLOOD LEFT ANTECUBITAL  Final   Special Requests   Final    BOTTLES DRAWN AEROBIC AND ANAEROBIC Blood Culture results may not be optimal due to an excessive volume of blood received in culture bottles   Culture  Setup Time PENDING  Incomplete   Culture   Final    NO GROWTH 2 DAYS Performed at [facility] [address], [place], [place] [address]    Report Status PENDING  Incomplete  Culture, blood (Routine X 2) w Reflex to ID Panel     Status: None (Preliminary result)   Collection Time: 12/27/16  5:10 PM  Result Value Ref Range Status   Specimen Description BLOOD BLOOD LEFT ARM  Final   Special Requests   Final    BOTTLES DRAWN AEROBIC AND ANAEROBIC Blood Culture results may not be optimal due to an excessive volume of blood received in culture bottles   Culture   Final    NO GROWTH 2 DAYS Performed at [facility], [place] [address], [place], [place] [address]    Report Status PENDING  Incomplete     Studies: No results found.  Scheduled Meds: . insulin aspart  0-9 Units Subcutaneous TID WC  . insulin detemir  15 Units Subcutaneous QHS  .  lisinopril  20 mg Oral Daily  . pantoprazole  40 mg Oral BID  . simvastatin  20 mg Oral q1800   Continuous Infusions: . sodium chloride 10 mL/hr at 12/29/16 1106  . 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 If 7PM-7AM, please contact night-coverage at [email or web address], password [facility] 12/30/2016, 9:17  AM  LOS: 3 days

## 2016-12-30 NOTE — H&P (View-Only) (Signed)
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 -Improving with antibiotics oncology following    Assessment/Plan:  Acute endometritis with sepsis in the setting of large necrotic carcinosarcoma of the uterus. CT abdomen and pelvis with large mass and findings concerning for Central necrosis -Gynecology Dr.Rossi following. Continue IV Zosyn, blood cultures: NGTD. Will deescalate antibiotics in 24-48 hrs.  -Plan for exploratory laparoscopy, TAH, BSO and lymphadenectomy along with exploration and possible distal pancreatectomy and partial gastrectomy by Dr. 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 Plan for partial gastrectomy by surgery in a month  Type 2 diabetes. last hemoglobin A1c was 8. Continue Levemir, sliding scale insulin  Anemia due to iron deficiency and chronic disease. Due to chronic blood loss from vaginal bleeding and chronic disease. transfused  1 unit of PRBC+IV iron  on 12/21. Patient refused labs today. Recheck AM. Will need iron supplement at discharge   Chronic kidney disease stage III. Baseline creatinine around 1.7. Stable, monitor  DVT prophylaxis:  DC lovenox and add SCDs due to ongoing vaginal bleeding Code Status: Full Code Family Communication: no family at bedside   disposition : Home pending clinical improvement, likely tomorrow    Consultants:  OBGYN  Procedures:  none  Antibiotics: Antibiotics Given (last 72 hours)    Date/Time Action Medication Dose Rate   12/27/16  1808 New Bag/Given   piperacillin-tazobactam (ZOSYN) IVPB 3.375 g 3.375 g 12.5 mL/hr   12/28/16 0248 New Bag/Given   piperacillin-tazobactam (ZOSYN) IVPB 3.375 g 3.375 g 12.5 mL/hr   12/28/16 0755 New Bag/Given   piperacillin-tazobactam (ZOSYN) IVPB 3.375 g 3.375 g 12.5 mL/hr   12/28/16 1821 New Bag/Given   piperacillin-tazobactam (ZOSYN) IVPB 3.375 g 3.375 g 12.5 mL/hr   12/29/16 0142 New Bag/Given   piperacillin-tazobactam (ZOSYN) IVPB 3.375 g 3.375 g 12.5 mL/hr   12/29/16 1105 New Bag/Given   piperacillin-tazobactam (ZOSYN) IVPB 3.375 g 3.375 g 12.5 mL/hr   12/29/16 1808 New Bag/Given   piperacillin-tazobactam (ZOSYN) IVPB 3.375 g 3.375 g 12.5 mL/hr   12/30/16 0102 New Bag/Given   piperacillin-tazobactam (ZOSYN) IVPB 3.375 g 3.375 g 12.5 mL/hr   12/30/16 0850 New Bag/Given   piperacillin-tazobactam (ZOSYN) IVPB 3.375 g 3.375 g 12.5 mL/hr        (indicate start date, and stop date if known)  HPI/Subjective: Alert. No distress. Reports mild abdominal pains. No vomiting. Refused labs AM.   Objective: Vitals:   12/30/16 0018 12/30/16 0436  BP:  (!) 161/80  Pulse:  83  Resp:  20  Temp: 100.2 F (37.9 C) 97.8 F (36.6 C)  SpO2:  100%    Intake/Output Summary (Last 24 hours) at 12/30/2016 0917 Last data filed at 12/30/2016 0600 Gross per 24 hour  Intake 1230 ml  Output -  Net 1230 ml   Filed Weights   12/27/16 1350  Weight: 77 kg (169 lb 12.1 oz)    Exam:   General:  Alert. No distress   Cardiovascular: s1, 2 rrr  Respiratory: CTA BL  Abdomen: distended. Mild tender. +BS  Musculoskeletal: no leg edema  Data Reviewed: Basic Metabolic Panel: Recent Labs  Lab 12/27/16 1916 12/28/16 0515  NA  --  139  K  --  4.4  CL  --  112*  CO2  --  22  GLUCOSE  --  156*  BUN  --  23*  CREATININE 1.57* 1.64*  CALCIUM  --  8.7*   Liver Function Tests: No results for input(s): AST, ALT, ALKPHOS, BILITOT, PROT, ALBUMIN in the last 168 hours. No results for  input(s): LIPASE, AMYLASE in the last 168 hours. No results for input(s): AMMONIA in the last 168 hours. CBC: Recent Labs  Lab 12/28/16 0946  WBC 11.3*  NEUTROABS 8.2*  HGB 7.8*  HCT 24.0*  MCV 86.3  PLT 398   Cardiac Enzymes: No results for input(s): CKTOTAL, CKMB, CKMBINDEX, TROPONINI in the last 168 hours. BNP (last 3 results) No results for input(s): BNP in the last 8760 hours.  ProBNP (last 3 results) No results for input(s): PROBNP in the last 8760 hours.  CBG: Recent Labs  Lab 12/29/16 0827 12/29/16 1214 12/29/16 1642 12/29/16 2154 12/30/16 0724  GLUCAP 95 189* 151* 170* 98    Recent Results (from the past 240 hour(s))  Culture, blood (Routine X 2) w Reflex to ID Panel     Status: None (Preliminary result)   Collection Time: 12/27/16  4:56 PM  Result Value Ref Range Status   Specimen Description BLOOD LEFT ANTECUBITAL  Final   Special Requests   Final    BOTTLES DRAWN AEROBIC AND ANAEROBIC Blood Culture results may not be optimal due to an excessive volume of blood received in culture bottles   Culture  Setup Time PENDING  Incomplete   Culture   Final    NO GROWTH 2 DAYS Performed at [facility] [address] [place], [place] [address]    Report Status PENDING  Incomplete  Culture, blood (Routine X 2) w Reflex to ID Panel     Status: None (Preliminary result)   Collection Time: 12/27/16  5:10 PM  Result Value Ref Range Status   Specimen Description BLOOD BLOOD LEFT ARM  Final   Special Requests   Final    BOTTLES DRAWN AEROBIC AND ANAEROBIC Blood Culture results may not be optimal due to an excessive volume of blood received in culture bottles   Culture   Final    NO GROWTH 2 DAYS Performed at [facility], [place] [address] [place], [place] [address]    Report Status PENDING  Incomplete     Studies: No results found.  Scheduled Meds: . insulin aspart  0-9 Units Subcutaneous TID WC  . insulin detemir  15 Units Subcutaneous QHS  .  lisinopril  20 mg Oral Daily  . pantoprazole  40 mg Oral BID  . simvastatin  20 mg Oral q1800   Continuous Infusions: . sodium chloride 10 mL/hr at 12/29/16 1106  . 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 If 7PM-7AM, please contact night-coverage at [email or web address], password [facility] 12/30/2016, 9:17  AM  LOS: 3 days

## 2016-12-30 NOTE — Progress Notes (Signed)
Pt refusing lab draws at this time. Pt educated on the importance of obtaining these results. Blood work rescheduled at this time by pt's request. Will continue to monitor closely.

## 2016-12-30 NOTE — Progress Notes (Signed)
Pharmacy Antibiotic Note  Martha Mcdaniel is a 83 y.o. female admitted on 12/27/2016 with endometrial infection.  Pharmacy has been consulted for Zosyn dosing.  Plan: Day 4 Abxs - Continue Zosyn 3.375g IV q8h (4 hour infusion time).  - Will f/u Scr if dose needs adjustment. - noted plan per Md to likely de-escalate abx's in 24-48hr   Height: 5\' 6"  (167.6 cm) Weight: 169 lb 12.1 oz (77 kg) IBW/kg (Calculated) : 59.3  Temp (24hrs), Avg:99.7 F (37.6 C), Min:97.8 F (36.6 C), Max:101 F (38.3 C)  Recent Labs  Lab 12/27/16 1710 12/27/16 1916 12/28/16 0515 12/28/16 0946  WBC  --   --   --  11.3*  CREATININE  --  1.57* 1.64*  --   LATICACIDVEN 1.3 1.4  --   --     Estimated Creatinine Clearance: 28.2 mL/min (A) (by C-G formula based on SCr of 1.64 mg/dL (H)).    Allergies  Allergen Reactions  . Ibuprofen Other (See Comments)    Pt feels disoriented with medication     Antimicrobials this admission: 12/19 Zosyn >>  Dose adjustments this admission:  Microbiology results: 12/19 BCx: ngtd  Thank you for allowing pharmacy to be a part of this patient's care.   Adrian Saran, PharmD, BCPS Pager [phone] 12/30/2016 11:30 AM

## 2016-12-31 DIAGNOSIS — Z794 Long term (current) use of insulin: Secondary | ICD-10-CM

## 2016-12-31 DIAGNOSIS — E1122 Type 2 diabetes mellitus with diabetic chronic kidney disease: Secondary | ICD-10-CM

## 2016-12-31 LAB — CREATININE, SERUM
CREATININE: 1.83 mg/dL — AB (ref 0.44–1.00)
GFR, EST AFRICAN AMERICAN: 29 mL/min — AB (ref >60)
GFR, EST NON AFRICAN AMERICAN: 25 mL/min — AB (ref >60)

## 2016-12-31 LAB — GLUCOSE, CAPILLARY
GLUCOSE-CAPILLARY: 90 mg/dL (ref 65–99)
Glucose-Capillary: 119 mg/dL — ABNORMAL HIGH (ref 65–99)

## 2016-12-31 LAB — CBC
HCT: 28.8 % — ABNORMAL LOW (ref 36.0–46.0)
Hemoglobin: 9.4 g/dL — ABNORMAL LOW (ref 12.0–15.0)
MCH: 28.1 pg (ref 26.0–34.0)
MCHC: 32.6 g/dL (ref 30.0–36.0)
MCV: 86 fL (ref 78.0–100.0)
PLATELETS: 423 10*3/uL — AB (ref 150–400)
RBC: 3.35 MIL/uL — ABNORMAL LOW (ref 3.87–5.11)
RDW: 15 % (ref 11.5–15.5)
WBC: 11.5 10*3/uL — ABNORMAL HIGH (ref 4.0–10.5)

## 2016-12-31 MED ORDER — PANTOPRAZOLE SODIUM 40 MG PO TBEC
40.0000 mg | DELAYED_RELEASE_TABLET | Freq: Two times a day (BID) | ORAL | 0 refills | Status: DC
Start: 2016-12-31 — End: 2017-02-28

## 2016-12-31 MED ORDER — OXYCODONE-ACETAMINOPHEN 5-325 MG PO TABS: 1.0000 | ORAL_TABLET | Freq: Three times a day (TID) | ORAL | 0 refills | Status: DC | PRN

## 2016-12-31 MED ORDER — AMOXICILLIN-POT CLAVULANATE 500-125 MG PO TABS: 1.0000 | ORAL_TABLET | Freq: Two times a day (BID) | ORAL | 0 refills | Status: AC

## 2016-12-31 MED ORDER — IRON 325 (65 FE) MG PO TABS: 1.0000 | ORAL_TABLET | Freq: Two times a day (BID) | ORAL | 0 refills | Status: AC

## 2016-12-31 NOTE — Discharge Summary (Signed)
Physician Discharge Summary  Martha Mcdaniel WPY:[id number] DOB: 1933/05/08 DOA: 12/27/2016  PCP: System, Pcp Not In  Admit date: 12/27/2016 Discharge date: 12/31/2016  Admitted From:home Disposition:home  Recommendations for Outpatient Follow-up:  1. Follow up with PCP, GYN, Surgery in 1-2 weeks 2. 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  Treated with antibiotics with clinical improvement.  #Patient reported that her abdominal pain is better.  She has no nausea vomiting.  She was evaluated by gynecologist recommended coronary risk surgery outpatient.  Patient is asking to go home today since she is feeling better.  She understand outpatient follow-up.  #Acute endometritis with sepsis: In the setting of large necrotic carcinoma of the uterus.  CT scan of abdomen pelvis reviewed.  Patient was evaluated by gynecologist.  As per gynecology note, plan for exploratory laparoscopy, TAH, BSO and lymphadenectomy along with exploration and possible distal pancreatectomy and partial gastrectomy by Dr. Barry Dienes from general surgery.  I recommended patient to continue outpatient follow-up.  Treated with IV Zosyn in the hospital.  Cultures negative.  Plan to continue 7 days of oral Augmentin.  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  #Type 2 diabetes on chronic insulin: Recommended to monitor blood sugar level.  Resume home insulin regimen.  Patient follows with PCP.  #Anemia due to iron deficiency and chronic disease: Received blood and IV iron transfusion.  Monitor CBC.  No sign of active bleeding.   Discharge with oral iron.  #Chronic kidney disease stage III: Serum creatinine level around baseline.  Patient has mild right hydronephrosis and proximal hydroureter likely in the setting of uterine mass.  Recommend outpatient follow-up.  #Patient is clinically stable.  Denied headache, dizziness, nausea vomiting chest pain shortness of breath.  Reported abdominal pain is better.  She is eager to go home today.  She understand outpatient follow-ups.  Discharged home in stable condition.    Discharge Diagnoses:  Principal Problem:   Acute endometritis Active Problems:   Other intra-abdominal and pelvic swelling, mass and lump   Hypertension   Type II diabetes mellitus with renal manifestations (HCC)   HLD (hyperlipidemia)   Sepsis (HCC)   Carcinosarcoma of endometrium (HCC)   Gastric ulcer   CKD (chronic kidney disease), stage III [facility])    Discharge Instructions  Discharge Instructions    Call MD for:  difficulty breathing, headache or visual disturbances   Complete by:  As directed    Call MD for:  extreme fatigue   Complete by:  As directed    Call MD for:  hives   Complete by:  As directed    Call MD for:  persistant dizziness or light-headedness   Complete by:  As directed    Call MD for:  persistant nausea and vomiting   Complete by:  As directed    Call MD for:  severe uncontrolled pain   Complete by:  As directed    Call MD for:  temperature >100.4   Complete by:  As directed    Diet - low sodium heart healthy   Complete by:  As directed    Diet Carb Modified   Complete by:  As directed    Discharge instructions   Complete  by:  As directed    Please follow-up with your gynecologist and general surgeon as scheduled outpatient.  Please come to emergency if you have fever, worsening pain or any new signs or symptoms.   Increase activity slowly   Complete by:  As directed      Allergies as of 12/31/2016      Reactions   Ibuprofen Other (See Comments)   Pt feels  disoriented with medication      Medication List    STOP taking these medications   sitaGLIPtin 50 MG tablet Commonly known as:  JANUVIA   terbinafine 250 MG tablet Commonly known as:  LAMISIL     TAKE these medications   acetaminophen 325 MG tablet Commonly known as:  TYLENOL Take 650 mg by mouth every 6 (six) hours as needed for mild pain or moderate pain.   amoxicillin-clavulanate 500-125 MG tablet Commonly known as:  AUGMENTIN Take 1 tablet (500 mg total) by mouth 2 (two) times daily for 7 days.   atorvastatin 20 MG tablet Commonly known as:  LIPITOR Take 20 mg by mouth daily.   insulin detemir 100 UNIT/ML injection Commonly known as:  LEVEMIR Inject 20 Units into the skin at bedtime.   insulin NPH-regular Human (70-30) 100 UNIT/ML injection Commonly known as:  NOVOLIN 70/30 Inject 20 Units into the skin every morning.   Iron 325 (65 Fe) MG Tabs Take 1 tablet (325 mg total) by mouth 2 (two) times daily.   lisinopril 20 MG tablet Commonly known as:  PRINIVIL,ZESTRIL Take 20 mg by mouth daily.   oxyCODONE-acetaminophen 5-325 MG tablet Commonly known as:  PERCOCET/ROXICET Take 1 tablet by mouth every 8 (eight) hours as needed for moderate pain.   pantoprazole 40 MG tablet Commonly known as:  PROTONIX Take 40 mg by mouth 2 (two) times daily.   VOLTAREN 1 % Gel Generic drug:  diclofenac sodium Apply 1 application topically 4 (four) times daily as needed for pain.      Follow-up Information    Everitt Amber, MD. Schedule an appointment as soon as possible for a visit in 1 week(s).   Specialty:  Obstetrics and Gynecology Contact information: [place] [address] [phone]        Stark Klein, MD. Schedule an appointment as soon as possible for a visit in 1 week(s).   Specialty:  General Surgery Contact information: [address] [place] [address] [phone]          Allergies  Allergen Reactions  .  Ibuprofen Other (See Comments)    Pt feels disoriented with medication     Consultations: GYN  Procedures/Studies: None  Subjective: Seen and examined at bedside.  Wanted to go home today.  Denies headache, dizziness, nausea vomiting chest pain shortness of breath.  Abdominal pain is better.  Tolerating diet well.  Discharge Exam: Vitals:   12/30/16 2114 12/31/16 0433  BP: (!) 147/65 135/67  Pulse: 97 91  Resp: 18 18  Temp: 99.5 F (37.5 C) 98.6 F (37 C)  SpO2: 100% 99%   Vitals:   12/30/16 0436 12/30/16 1240 12/30/16 2114 12/31/16 0433  BP: (!) 161/80 138/68 (!) 147/65 135/67  Pulse: 83 90 97 91  Resp: 20 18 18 18   Temp: 97.8 F (36.6 C) 98.3 F (36.8 C) 99.5 F (37.5 C) 98.6 F (37 C)  TempSrc: Oral Oral Oral Oral  SpO2: 100% 100% 100% 99%  Weight:      Height:  General: Pt is alert, awake, not in acute distress Cardiovascular: RRR, S1/S2 +, no rubs, no gallops Respiratory: CTA bilaterally, no wheezing, no rhonchi Abdominal: Soft, NT, ND, bowel sounds + Extremities: no edema, no cyanosis    The results of significant diagnostics from this hospitalization (including imaging, microbiology, ancillary and laboratory) are listed below for reference.     Microbiology: Recent Results (from the past 240 hour(s))  Culture, blood (Routine X 2) w Reflex to ID Panel     Status: None (Preliminary result)   Collection Time: 12/27/16  4:56 PM  Result Value Ref Range Status   Specimen Description BLOOD LEFT ANTECUBITAL  Final   Special Requests   Final    BOTTLES DRAWN AEROBIC AND ANAEROBIC Blood Culture results may not be optimal due to an excessive volume of blood received in culture bottles   Culture   Final    NO GROWTH 3 DAYS Performed at [facility] [address] [place], [place] [address]    Report Status PENDING  Incomplete  Culture, blood (Routine X 2) w Reflex to ID Panel     Status: None (Preliminary result)   Collection Time: 12/27/16   5:10 PM  Result Value Ref Range Status   Specimen Description BLOOD BLOOD LEFT ARM  Final   Special Requests   Final    BOTTLES DRAWN AEROBIC AND ANAEROBIC Blood Culture results may not be optimal due to an excessive volume of blood received in culture bottles   Culture   Final    NO GROWTH 3 DAYS Performed at [facility], [place] [address], [place],  [address]    Report Status PENDING  Incomplete     Labs: BNP (last 3 results) No results for input(s): BNP in the last 8760 hours. Basic Metabolic Panel: Recent Labs  Lab 12/27/16 1916 12/28/16 0515 12/31/16 0518  NA  --  139  --   K  --  4.4  --   CL  --  112*  --   CO2  --  22  --   GLUCOSE  --  156*  --   BUN  --  23*  --   CREATININE 1.57* 1.64* 1.83*  CALCIUM  --  8.7*  --    Liver Function Tests: No results for input(s): AST, ALT, ALKPHOS, BILITOT, PROT, ALBUMIN in the last 168 hours. No results for input(s): LIPASE, AMYLASE in the last 168 hours. No results for input(s): AMMONIA in the last 168 hours. CBC: Recent Labs  Lab 12/28/16 0946 12/31/16 0518  WBC 11.3* 11.5*  NEUTROABS 8.2*  --   HGB 7.8* 9.4*  HCT 24.0* 28.8*  MCV 86.3 86.0  PLT 398 423*   Cardiac Enzymes: No results for input(s): CKTOTAL, CKMB, CKMBINDEX, TROPONINI in the last 168 hours. BNP: Invalid input(s): POCBNP CBG: Recent Labs  Lab 12/30/16 0724 12/30/16 1131 12/30/16 1652 12/30/16 2107 12/31/16 0751  GLUCAP 98 104* 109* 155* 90   D-Dimer No results for input(s): DDIMER in the last 72 hours. Hgb A1c No results for input(s): HGBA1C in the last 72 hours. Lipid Profile No results for input(s): CHOL, HDL, LDLCALC, TRIG, CHOLHDL, LDLDIRECT in the last 72 hours. Thyroid function studies No results for input(s): TSH, T4TOTAL, T3FREE, THYROIDAB in the last 72 hours.  Invalid input(s): FREET3 Anemia work up No results for input(s): VITAMINB12, FOLATE, FERRITIN, TIBC, IRON, RETICCTPCT in the last 72 hours. Urinalysis No  results found for: COLORURINE, APPEARANCEUR, [place], [place], [place],  HGBUR, BILIRUBINUR, KETONESUR, PROTEINUR, UROBILINOGEN, NITRITE, LEUKOCYTESUR Sepsis Labs Invalid input(s): PROCALCITONIN,  WBC,  LACTICIDVEN Microbiology Recent Results (from the past 240 hour(s))  Culture, blood (Routine X 2) w Reflex to ID Panel     Status: None (Preliminary result)   Collection Time: 12/27/16  4:56 PM  Result Value Ref Range Status   Specimen Description BLOOD LEFT ANTECUBITAL  Final   Special Requests   Final    BOTTLES DRAWN AEROBIC AND ANAEROBIC Blood Culture results may not be optimal due to an excessive volume of blood received in culture bottles   Culture   Final    NO GROWTH 3 DAYS Performed at [facility], [place] [address], [place], [place] [address]    Report Status PENDING  Incomplete  Culture, blood (Routine X 2) w Reflex to ID Panel     Status: None (Preliminary result)   Collection Time: 12/27/16  5:10 PM  Result Value Ref Range Status   Specimen Description BLOOD BLOOD LEFT ARM  Final   Special Requests   Final    BOTTLES DRAWN AEROBIC AND ANAEROBIC Blood Culture results may not be optimal due to an excessive volume of blood received in culture bottles   Culture   Final    NO GROWTH 3 DAYS Performed at [facility], [place] [address] [place], [place] [address]    Report Status PENDING  Incomplete     Time coordinating discharge: 36 minutes  SIGNED:   Rosita Fire, MD  [facility] 12/31/2016, 11:42 AM  If 7PM-7AM, please contact night-coverage [email or web address] Password [email or web address]

## 2016-12-31 NOTE — Care Management Important Message (Signed)
Important Message  Patient Details  Name: Kerrigan Gombos MRN: [id number] Date of Birth: 19-Mar-1933   Medicare Important Message Given:  Yes    Erenest Rasher, RN 12/31/2016, 12:21 PM

## 2017-01-01 LAB — CULTURE, BLOOD (ROUTINE X 2)
Culture: NO GROWTH
Culture: NO GROWTH

## 2017-01-03 ENCOUNTER — Encounter (HOSPITAL_COMMUNITY): Payer: Self-pay | Admitting: Certified Registered Nurse Anesthetist

## 2017-01-04 ENCOUNTER — Other Ambulatory Visit: Payer: Self-pay

## 2017-01-04 ENCOUNTER — Encounter (HOSPITAL_COMMUNITY): Payer: Self-pay

## 2017-01-04 ENCOUNTER — Ambulatory Visit (HOSPITAL_COMMUNITY)
Admission: RE | Admit: 2017-01-04 | Discharge: 2017-01-04 | Disposition: A | Discharge status: Home / Self Care | Payer: Medicare Other | Source: Ambulatory Visit | Attending: Gastroenterology | Admitting: Gastroenterology

## 2017-01-04 ENCOUNTER — Encounter (HOSPITAL_COMMUNITY)
Admission: RE | Disposition: A | Discharge status: Home / Self Care | Payer: Self-pay | Source: Ambulatory Visit | Attending: Gastroenterology

## 2017-01-04 ENCOUNTER — Telehealth: Payer: Self-pay

## 2017-01-04 DIAGNOSIS — Z5309 Procedure and treatment not carried out because of other contraindication: Secondary | ICD-10-CM | POA: Insufficient documentation

## 2017-01-04 DIAGNOSIS — C55 Malignant neoplasm of uterus, part unspecified: Secondary | ICD-10-CM | POA: Insufficient documentation

## 2017-01-04 DIAGNOSIS — Z794 Long term (current) use of insulin: Secondary | ICD-10-CM | POA: Diagnosis not present

## 2017-01-04 DIAGNOSIS — K259 Gastric ulcer, unspecified as acute or chronic, without hemorrhage or perforation: Secondary | ICD-10-CM | POA: Insufficient documentation

## 2017-01-04 DIAGNOSIS — D638 Anemia in other chronic diseases classified elsewhere: Secondary | ICD-10-CM | POA: Diagnosis not present

## 2017-01-04 DIAGNOSIS — E785 Hyperlipidemia, unspecified: Secondary | ICD-10-CM | POA: Diagnosis not present

## 2017-01-04 DIAGNOSIS — K3189 Other diseases of stomach and duodenum: Secondary | ICD-10-CM

## 2017-01-04 DIAGNOSIS — N710 Acute inflammatory disease of uterus: Secondary | ICD-10-CM | POA: Insufficient documentation

## 2017-01-04 DIAGNOSIS — Z79899 Other long term (current) drug therapy: Secondary | ICD-10-CM | POA: Insufficient documentation

## 2017-01-04 DIAGNOSIS — N183 Chronic kidney disease, stage 3 (moderate): Secondary | ICD-10-CM | POA: Insufficient documentation

## 2017-01-04 DIAGNOSIS — N939 Abnormal uterine and vaginal bleeding, unspecified: Secondary | ICD-10-CM | POA: Diagnosis not present

## 2017-01-04 DIAGNOSIS — I129 Hypertensive chronic kidney disease with stage 1 through stage 4 chronic kidney disease, or unspecified chronic kidney disease: Secondary | ICD-10-CM | POA: Diagnosis not present

## 2017-01-04 DIAGNOSIS — E1122 Type 2 diabetes mellitus with diabetic chronic kidney disease: Secondary | ICD-10-CM | POA: Diagnosis not present

## 2017-01-04 DIAGNOSIS — R1902 Left upper quadrant abdominal swelling, mass and lump: Secondary | ICD-10-CM

## 2017-01-04 SURGERY — CANCELLED PROCEDURE

## 2017-01-04 MED ORDER — PROPOFOL 10 MG/ML IV BOLUS
INTRAVENOUS | Status: AC
  Filled 2017-01-04: qty 60

## 2017-01-04 MED ORDER — SODIUM CHLORIDE 0.9 % IV SOLN: INTRAVENOUS | Status: DC

## 2017-01-04 NOTE — Progress Notes (Signed)
Patient's temperature was 100.5. Dr. Ardis Hughs made aware.  Procedure cancelled.  Pt instructed to reschedule for a later time upon completion of current antibiotic treatment.  Laverta Baltimore, RN

## 2017-01-04 NOTE — Telephone Encounter (Signed)
I tried to call both numbers listed and no answer and no voice mail.  Instructions mailed for 01/18/17 EUS

## 2017-01-04 NOTE — Interval H&P Note (Signed)
History and Physical Interval Note:  01/04/2017 12:13 PM  Martha Mcdaniel  has presented today for surgery, with the diagnosis of LUQ mass  The various methods of treatment have been discussed with the patient and family. After consideration of risks, benefits and other options for treatment, the patient has consented to  Procedure(s): UPPER ENDOSCOPIC ULTRASOUND (EUS) RADIAL (N/A) as a surgical intervention .  The patient's history has been reviewed, patient examined, no change in status, stable for surgery.  I have reviewed the patient's chart and labs.  Questions were answered to the patient's satisfaction.     Milus Banister

## 2017-01-04 NOTE — Telephone Encounter (Signed)
Actually she is planned to have a surgery later in the month and so the sooner the better.  Can you swap her into last case spot of the day for the 10th and move that patient to the 17th instead?  thanks

## 2017-01-04 NOTE — Anesthesia Preprocedure Evaluation (Deleted)
Anesthesia Evaluation    Airway        Dental   Pulmonary former smoker,           Cardiovascular hypertension, Pt. on medications      Neuro/Psych    GI/Hepatic GERD  Medicated,  Endo/Other  diabetes, Insulin Dependent  Renal/GU Renal InsufficiencyRenal disease (creat 1.83)     Musculoskeletal   Abdominal   Peds  Hematology Hb 9.4   Anesthesia Other Findings   Reproductive/Obstetrics                             Anesthesia Physical Anesthesia Plan Anesthesia Quick Evaluation

## 2017-01-04 NOTE — Telephone Encounter (Signed)
Is 01/25/17 ok?  The 10 th is full for MAC.

## 2017-01-04 NOTE — Telephone Encounter (Signed)
-----   Message from Milus Banister, MD sent at 01/04/2017 12:36 PM EST ----- Drs Barry Dienes and Denman George, She showed today for EUS FNA biopsy of the gastric mass but is still in 8/10 abd pain and having low grade fever (100.5) in admitting. She has 1-2 more days abx for her recent endometritis infection.  I didn't think today is a good time for elective biopsy and so cancelled the case. Will plan on trying again in 2 weeks (i'm out of town next week). Thanks  Joesph Marcy, She needs upper EUS in 2 weeks from today for gastric mass, see above. Thanks

## 2017-01-08 ENCOUNTER — Encounter (HOSPITAL_COMMUNITY): Payer: Self-pay | Admitting: *Deleted

## 2017-01-08 ENCOUNTER — Other Ambulatory Visit: Payer: Self-pay

## 2017-01-10 NOTE — Telephone Encounter (Signed)
EUS scheduled, pt instructed and medications reviewed.  Patient instructions mailed to home.  Patient to call with any questions or concerns.  

## 2017-01-15 ENCOUNTER — Telehealth: Payer: Self-pay | Admitting: Gynecologic Oncology

## 2017-01-15 NOTE — Telephone Encounter (Signed)
Left message for Dr. Marlowe Aschoff surgery scheduler to continue working on scheduling patient for joint surgical case on Jan 29.  Previous message had been left on Dec 6.

## 2017-01-18 ENCOUNTER — Ambulatory Visit (HOSPITAL_COMMUNITY): Payer: Medicare Other | Admitting: Anesthesiology

## 2017-01-18 ENCOUNTER — Ambulatory Visit (HOSPITAL_COMMUNITY)
Admission: RE | Admit: 2017-01-18 | Discharge: 2017-01-18 | Disposition: A | Discharge status: Home / Self Care | Payer: Medicare Other | Source: Ambulatory Visit | Attending: Gastroenterology | Admitting: Gastroenterology

## 2017-01-18 ENCOUNTER — Encounter (HOSPITAL_COMMUNITY): Payer: Self-pay

## 2017-01-18 ENCOUNTER — Encounter (HOSPITAL_COMMUNITY)
Admission: RE | Disposition: A | Discharge status: Home / Self Care | Payer: Self-pay | Source: Ambulatory Visit | Attending: Gastroenterology

## 2017-01-18 ENCOUNTER — Other Ambulatory Visit: Payer: Self-pay

## 2017-01-18 DIAGNOSIS — D631 Anemia in chronic kidney disease: Secondary | ICD-10-CM | POA: Diagnosis not present

## 2017-01-18 DIAGNOSIS — N183 Chronic kidney disease, stage 3 (moderate): Secondary | ICD-10-CM | POA: Diagnosis not present

## 2017-01-18 DIAGNOSIS — Z8711 Personal history of peptic ulcer disease: Secondary | ICD-10-CM | POA: Insufficient documentation

## 2017-01-18 DIAGNOSIS — E1122 Type 2 diabetes mellitus with diabetic chronic kidney disease: Secondary | ICD-10-CM | POA: Diagnosis not present

## 2017-01-18 DIAGNOSIS — I129 Hypertensive chronic kidney disease with stage 1 through stage 4 chronic kidney disease, or unspecified chronic kidney disease: Secondary | ICD-10-CM | POA: Diagnosis not present

## 2017-01-18 DIAGNOSIS — Z794 Long term (current) use of insulin: Secondary | ICD-10-CM | POA: Diagnosis not present

## 2017-01-18 DIAGNOSIS — Z886 Allergy status to analgesic agent status: Secondary | ICD-10-CM | POA: Insufficient documentation

## 2017-01-18 DIAGNOSIS — Z87891 Personal history of nicotine dependence: Secondary | ICD-10-CM | POA: Insufficient documentation

## 2017-01-18 DIAGNOSIS — D509 Iron deficiency anemia, unspecified: Secondary | ICD-10-CM | POA: Diagnosis not present

## 2017-01-18 DIAGNOSIS — C55 Malignant neoplasm of uterus, part unspecified: Secondary | ICD-10-CM | POA: Diagnosis not present

## 2017-01-18 DIAGNOSIS — E785 Hyperlipidemia, unspecified: Secondary | ICD-10-CM | POA: Diagnosis not present

## 2017-01-18 DIAGNOSIS — M1990 Unspecified osteoarthritis, unspecified site: Secondary | ICD-10-CM | POA: Diagnosis not present

## 2017-01-18 DIAGNOSIS — R933 Abnormal findings on diagnostic imaging of other parts of digestive tract: Secondary | ICD-10-CM | POA: Diagnosis present

## 2017-01-18 DIAGNOSIS — C169 Malignant neoplasm of stomach, unspecified: Secondary | ICD-10-CM | POA: Diagnosis not present

## 2017-01-18 DIAGNOSIS — K3189 Other diseases of stomach and duodenum: Secondary | ICD-10-CM | POA: Diagnosis not present

## 2017-01-18 HISTORY — DX: Malignant (primary) neoplasm, unspecified: C80.1

## 2017-01-18 HISTORY — DX: Anemia, unspecified: D64.9

## 2017-01-18 HISTORY — PX: EUS: SHX5427

## 2017-01-18 HISTORY — DX: Unspecified osteoarthritis, unspecified site: M19.90

## 2017-01-18 LAB — GLUCOSE, CAPILLARY: Glucose-Capillary: 133 mg/dL — ABNORMAL HIGH (ref 65–99)

## 2017-01-18 SURGERY — UPPER ENDOSCOPIC ULTRASOUND (EUS) LINEAR
Anesthesia: Monitor Anesthesia Care

## 2017-01-18 MED ORDER — PROPOFOL 10 MG/ML IV BOLUS
INTRAVENOUS | Status: AC
  Filled 2017-01-18: qty 20

## 2017-01-18 MED ORDER — PROPOFOL 10 MG/ML IV BOLUS
INTRAVENOUS | Status: DC | PRN
  Administered 2017-01-18 (×7): 10 mg via INTRAVENOUS
  Administered 2017-01-18 (×2): 20 mg via INTRAVENOUS
  Administered 2017-01-18: 10 mg via INTRAVENOUS
  Administered 2017-01-18: 20 mg via INTRAVENOUS
  Administered 2017-01-18 (×7): 10 mg via INTRAVENOUS
  Administered 2017-01-18: 20 mg via INTRAVENOUS
  Administered 2017-01-18: 10 mg via INTRAVENOUS
  Administered 2017-01-18: 20 mg via INTRAVENOUS
  Administered 2017-01-18 (×2): 10 mg via INTRAVENOUS

## 2017-01-18 MED ORDER — SODIUM CHLORIDE 0.9 % IV SOLN: INTRAVENOUS | Status: DC

## 2017-01-18 MED ORDER — LIDOCAINE 2% (20 MG/ML) 5 ML SYRINGE
INTRAMUSCULAR | Status: DC | PRN
  Administered 2017-01-18: 40 mg via INTRAVENOUS

## 2017-01-18 MED ORDER — LACTATED RINGERS IV SOLN
INTRAVENOUS | Status: DC
Start: 2017-01-18 — End: 2017-01-18
  Administered 2017-01-18: 1000 mL via INTRAVENOUS

## 2017-01-18 MED ORDER — PROPOFOL 10 MG/ML IV BOLUS
INTRAVENOUS | Status: AC
Start: 2017-01-18 — End: ?
  Filled 2017-01-18: qty 20

## 2017-01-18 MED ORDER — ONDANSETRON HCL 4 MG/2ML IJ SOLN
INTRAMUSCULAR | Status: DC | PRN
  Administered 2017-01-18: 4 mg via INTRAVENOUS

## 2017-01-18 NOTE — Transfer of Care (Signed)
Immediate Anesthesia Transfer of Care Note  Patient: Martha Mcdaniel  Procedure(s) Performed: UPPER ENDOSCOPIC ULTRASOUND (EUS) LINEAR (N/A )  Patient Location: PACU  Anesthesia Type:MAC  Level of Consciousness: sedated  Airway & Oxygen Therapy: Patient Spontanous Breathing and Patient connected to nasal cannula oxygen  Post-op Assessment: Report given to RN and Post -op Vital signs reviewed and stable  Post vital signs: Reviewed and stable  Last Vitals:  Vitals:   01/18/17 1053  BP: (!) 151/60  Pulse: (!) 106  Resp: 15  Temp: 36.9 C  SpO2: 100%    Last Pain:  Vitals:   01/18/17 1053  TempSrc: Oral  PainSc: 6          Complications: No apparent anesthesia complications

## 2017-01-18 NOTE — Anesthesia Preprocedure Evaluation (Signed)
Anesthesia Evaluation  Patient identified by MRN, date of birth, ID band Patient awake    Reviewed: Allergy & Precautions, H&P , NPO status , Patient's Chart, lab work & pertinent test results  Airway Mallampati: II   Neck ROM: full    Dental   Pulmonary former smoker,    breath sounds clear to auscultation       Cardiovascular hypertension,  Rhythm:regular Rate:Normal     Neuro/Psych    GI/Hepatic PUD,   Endo/Other  diabetes, Type 2  Renal/GU Renal InsufficiencyRenal disease     Musculoskeletal  (+) Arthritis ,   Abdominal   Peds  Hematology   Anesthesia Other Findings   Reproductive/Obstetrics                             Anesthesia Physical Anesthesia Plan  ASA: III  Anesthesia Plan: MAC   Post-op Pain Management:    Induction: Intravenous  PONV Risk Score and Plan: 2 and Propofol infusion and Treatment may vary due to age or medical condition  Airway Management Planned: Nasal Cannula  Additional Equipment:   Intra-op Plan:   Post-operative Plan:   Informed Consent: I have reviewed the patients History and Physical, chart, labs and discussed the procedure including the risks, benefits and alternatives for the proposed anesthesia with the patient or authorized representative who has indicated his/her understanding and acceptance.     Plan Discussed with: CRNA, Anesthesiologist and Surgeon  Anesthesia Plan Comments:         Anesthesia Quick Evaluation

## 2017-01-18 NOTE — Op Note (Signed)
[facility] Patient Name: Martha Mcdaniel Procedure Date: 01/18/2017 MRN: [id number] Attending MD: Milus Banister , MD Date of Birth: Jun 30, 1933 CSN: [id number] Age: 84 Admit Type: Outpatient Procedure:                Upper EUS Indications:              Suspected mass in stomach on CT scan, in setting of                            newly diagnosed uterine carcinosarcoma. Providers:                Milus Banister, MD, Kingsley Plan, RN, Cherylynn Ridges, Technician, Marla Roe, CRNA Referring MD:             Stark Klein, MD Medicines:                Monitored Anesthesia Care Complications:            No immediate complications. Estimated blood loss:                            None. Estimated Blood Loss:     Estimated blood loss: none. Procedure:                Pre-Anesthesia Assessment:                           - Prior to the procedure, a History and Physical                            was performed, and patient medications and                            allergies were reviewed. The patient's tolerance of                            previous anesthesia was also reviewed. The risks                            and benefits of the procedure and the sedation                            options and risks were discussed with the patient.                            All questions were answered, and informed consent                            was obtained. Prior Anticoagulants: The patient has                            taken no previous anticoagulant or antiplatelet  agents. ASA Grade Assessment: II - A patient with                            mild systemic disease. After reviewing the risks                            and benefits, the patient was deemed in                            satisfactory condition to undergo the procedure.                           After obtaining informed consent, the endoscope was            passed under direct vision. Throughout the                            procedure, the patient's blood pressure, pulse, and                            oxygen saturations were monitored continuously. The                            [id number] ([id number]) scope was introduced through                            the mouth, and advanced to the antrum of the                            stomach. The [id number] ([id number]) scope was                            introduced through the mouth, and advanced to the                            antrum of the stomach. The upper EUS was                            accomplished without difficulty. The patient                            tolerated the procedure well. Scope In: Scope Out: Findings:      Endoscopic Finding :      1. Normal esophagus.      2. Normal stomach.      Endosonographic Finding :      1. There was an round, irregularly bordered, predominantly hypoechoic       but also heavily calcified mass that appears to communicate with the       muscularis propria layer of the gastric body wall. This appears discrete       from the pancreatic tail. The mass measures 4cm across, the       calcification within the mass measures 1.7cm. The mass was sampled with       4 transgastric passes (2 with a 25 gauge needle and 2 with a 22  gauge       needle).      2. Gastric wall was otherwise normal.      3. Pancreatic parenchyma was normal      4. Main pancreatic duct was normal.      5. No perigastric adenopathy.      6. Limited views of liver, spleen, portal and splenic vessels were all       normal. Impression:               - Partically calcified, 4cm mass that appears to                            originate in the wall of the gastric body                            (muscularis propria layer). Sampled by FNA,                            preliminary cytology shows spindle shaped cells,                            await final cytology results. This is  most                            consistent with a GIST of the stomach.                           - In setting of uterine carcinosarcoma Moderate Sedation:      N/A- Per Anesthesia Care Recommendation:           - Discharge patient to home (ambulatory). Procedure Code(s):        --- Professional ---                           [phone], 48, Esophagogastroduodenoscopy, flexible,                            transoral; with transendoscopic ultrasound-guided                            intramural or transmural fine needle                            aspiration/biopsy(s), (includes endoscopic                            ultrasound examination limited to the esophagus,                            stomach or duodenum, and adjacent structures) Diagnosis Code(s):        --- Professional ---                           K31.89, Other diseases of stomach and duodenum                           R93.3, Abnormal  findings on diagnostic imaging of                            other parts of digestive tract CPT copyright 2016 [employer]. All rights reserved. The codes documented in this report are preliminary and upon coder review may  be revised to meet current compliance requirements. Milus Banister, MD 01/18/2017 12:22:44 PM This report has been signed electronically. Number of Addenda: 0

## 2017-01-18 NOTE — Discharge Instructions (Signed)

## 2017-01-18 NOTE — Anesthesia Postprocedure Evaluation (Signed)
Anesthesia Post Note  Patient: Martha Mcdaniel  Procedure(s) Performed: UPPER ENDOSCOPIC ULTRASOUND (EUS) LINEAR (N/A )     Patient location during evaluation: PACU Anesthesia Type: MAC Level of consciousness: awake and alert Pain management: pain level controlled Vital Signs Assessment: post-procedure vital signs reviewed and stable Respiratory status: spontaneous breathing, nonlabored ventilation, respiratory function stable and patient connected to nasal cannula oxygen Cardiovascular status: stable and blood pressure returned to baseline Postop Assessment: no apparent nausea or vomiting Anesthetic complications: no    Last Vitals:  Vitals:   01/18/17 1235 01/18/17 1240  BP:  133/67  Pulse: 96 95  Resp: (!) 24 19  Temp:    SpO2: 99% 96%    Last Pain:  Vitals:   01/18/17 1214  TempSrc: Oral  PainSc:                  [place]

## 2017-01-18 NOTE — Interval H&P Note (Signed)
History and Physical Interval Note:  01/18/2017 11:03 AM  Martha Mcdaniel  has presented today for surgery, with the diagnosis of gastric mass  The various methods of treatment have been discussed with the patient and family. After consideration of risks, benefits and other options for treatment, the patient has consented to  Procedure(s): UPPER ENDOSCOPIC ULTRASOUND (EUS) LINEAR (N/A) as a surgical intervention .  The patient's history has been reviewed, patient examined, no change in status, stable for surgery.  I have reviewed the patient's chart and labs.  Questions were answered to the patient's satisfaction.     Milus Banister

## 2017-01-19 ENCOUNTER — Encounter (HOSPITAL_COMMUNITY): Payer: Self-pay | Admitting: Gastroenterology

## 2017-01-26 ENCOUNTER — Encounter (HOSPITAL_COMMUNITY): Payer: Self-pay

## 2017-01-26 ENCOUNTER — Other Ambulatory Visit (HOSPITAL_COMMUNITY): Payer: Self-pay | Admitting: *Deleted

## 2017-01-26 NOTE — Patient Instructions (Addendum)
Martha Mcdaniel  01/26/2017   Your procedure is scheduled on: 02-06-17  Report to [facility] Main  Entrance  Follow signs to Short Stay on first floor at 530 AM  Call this number if you have problems the morning of surgery [phone]   Remember: Do not eat food or drink liquids :After Midnight.  Eat a light diet the day before surgery.  Examples including soups, broths, toast, yogurt, mashed potatoes.  Things to avoid include carbonated beverages (fizzy beverages), raw fruits and raw vegetables, or beans.   If your bowels are filled with gas, your surgeon will have difficulty visualizing your pelvic organs which increases your surgical risks.   Take these medicines the morning of surgery with A SIP OF WATER:PANTAPRAZOLE ([place] ) DO NOT TAKE ANY DIABETIC MEDICATIONS DAY OF YOUR SURGERY                               You may not have any metal on your body including hair pins and              piercings  Do not wear jewelry, make-up, lotions, powders or perfumes, deodorant             Do not wear nail polish.  Do not shave  48 hours prior to surgery.              Men may shave face and neck.   Do not bring valuables to the hospital. [place].  Contacts, dentures or bridgework may not be worn into surgery.  Leave suitcase in the car. After surgery it may be brought to your room.                   Please read over the following fact sheets you were given: _____________________________________________________________________  Why is it important to control my blood sugar before and after surgery? . Improving blood sugar levels before and after surgery helps healing and can limit problems. . A way of improving blood sugar control is eating a healthy diet by: o  Eating less sugar and carbohydrates o  Increasing activity/exercise o  Talking with your doctor about reaching your blood sugar goals . High blood  sugars (greater than 180 mg/dL) can raise your risk of infections and slow your recovery, so you will need to focus on controlling your diabetes during the weeks before surgery. . Make sure that the doctor who takes care of your diabetes knows about your planned surgery including the date and location.  How do I manage my blood sugar before surgery? . Check your blood sugar at least 4 times a day, starting 2 days before surgery, to make sure that the level is not too high or low. o Check your blood sugar the morning of your surgery when you wake up and every 2 hours until you get to the Short Stay unit. . If your blood sugar is less than 70 mg/dL, you will need to treat for low blood sugar: o Do not take insulin. o Treat a low blood sugar (less than 70 mg/dL) with  cup of clear juice (cranberry or apple), 4 glucose tablets, OR glucose gel. o Recheck blood sugar in 15 minutes after treatment (to  make sure it is greater than 70 mg/dL). If your blood sugar is not greater than 70 mg/dL on recheck, call [phone] for further instructions. . Report your blood sugar to the short stay nurse when you get to Short Stay.  . If you are admitted to the hospital after surgery: o Your blood sugar will be checked by the staff and you will probably be given insulin after surgery (instead of oral diabetes medicines) to make sure you have good blood sugar levels. o The goal for blood sugar control after surgery is 80-180 mg/dL.   WHAT DO I DO ABOUT MY DIABETES MEDICATION?  . THE NIGHT BEFORE SURGERY, take  10 UNITS    units of LEVEMIR      insulin.      [place] - Preparing for Surgery Before surgery, you can play an important role.  Because skin is not sterile, your skin needs to be as free of germs as possible.  You can reduce the number of germs on your skin by washing with CHG (chlorahexidine gluconate) soap before surgery.  CHG is an antiseptic cleaner which kills germs and bonds with the skin to  continue killing germs even after washing. Please DO NOT use if you have an allergy to CHG or antibacterial soaps.  If your skin becomes reddened/irritated stop using the CHG and inform your nurse when you arrive at Short Stay. Do not shave (including legs and underarms) for at least 48 hours prior to the first CHG shower.  You may shave your face/neck. Please follow these instructions carefully:  1.  Shower with CHG Soap the night before surgery and the  morning of Surgery.  2.  If you choose to wash your hair, wash your hair first as usual with your  normal  shampoo.  3.  After you shampoo, rinse your hair and body thoroughly to remove the  shampoo.                           4.  Use CHG as you would any other liquid soap.  You can apply chg directly  to the skin and wash                       Gently with a scrungie or clean washcloth.  5.  Apply the CHG Soap to your body ONLY FROM THE NECK DOWN.   Do not use on face/ open                           Wound or open sores. Avoid contact with eyes, ears mouth and genitals (private parts).                       Wash face,  Genitals (private parts) with your normal soap.             6.  Wash thoroughly, paying special attention to the area where your surgery  will be performed.  7.  Thoroughly rinse your body with warm water from the neck down.  8.  DO NOT shower/wash with your normal soap after using and rinsing off  the CHG Soap.                9.  Pat yourself dry with a clean towel.            10.  Wear clean  pajamas.            11.  Place clean sheets on your bed the night of your first shower and do not  sleep with pets. Day of Surgery : Do not apply any lotions/deodorants the morning of surgery.  Please wear clean clothes to the hospital/surgery center.  FAILURE TO FOLLOW THESE INSTRUCTIONS MAY RESULT IN THE CANCELLATION OF YOUR SURGERY PATIENT SIGNATURE_________________________________  NURSE  SIGNATURE__________________________________  ________________________________________________________________________   Adam Phenix  An incentive spirometer is a tool that can help keep your lungs clear and active. This tool measures how well you are filling your lungs with each breath. Taking long deep breaths may help reverse or decrease the chance of developing breathing (pulmonary) problems (especially infection) following:  A long period of time when you are unable to move or be active. BEFORE THE PROCEDURE   If the spirometer includes an indicator to show your best effort, your nurse or respiratory therapist will set it to a desired goal.  If possible, sit up straight or lean slightly forward. Try not to slouch.  Hold the incentive spirometer in an upright position. INSTRUCTIONS FOR USE  1. Sit on the edge of your bed if possible, or sit up as far as you can in bed or on a chair. 2. Hold the incentive spirometer in an upright position. 3. Breathe out normally. 4. Place the mouthpiece in your mouth and seal your lips tightly around it. 5. Breathe in slowly and as deeply as possible, raising the piston or the ball toward the top of the column. 6. Hold your breath for 3-5 seconds or for as long as possible. Allow the piston or ball to fall to the bottom of the column. 7. Remove the mouthpiece from your mouth and breathe out normally. 8. Rest for a few seconds and repeat Steps 1 through 7 at least 10 times every 1-2 hours when you are awake. Take your time and take a few normal breaths between deep breaths. 9. The spirometer may include an indicator to show your best effort. Use the indicator as a goal to work toward during each repetition. 10. After each set of 10 deep breaths, practice coughing to be sure your lungs are clear. If you have an incision (the cut made at the time of surgery), support your incision when coughing by placing a pillow or rolled up towels firmly  against it. Once you are able to get out of bed, walk around indoors and cough well. You may stop using the incentive spirometer when instructed by your caregiver.  RISKS AND COMPLICATIONS  Take your time so you do not get dizzy or light-headed.  If you are in pain, you may need to take or ask for pain medication before doing incentive spirometry. It is harder to take a deep breath if you are having pain. AFTER USE  Rest and breathe slowly and easily.  It can be helpful to keep track of a log of your progress. Your caregiver can provide you with a simple table to help with this. If you are using the spirometer at home, follow these instructions: [place] IF:   You are having difficultly using the spirometer.  You have trouble using the spirometer as often as instructed.  Your pain medication is not giving enough relief while using the spirometer.  You develop fever of 100.5 F (38.1 C) or higher. SEEK IMMEDIATE MEDICAL CARE IF:   You cough up bloody sputum that had not  been present before.  You develop fever of 102 F (38.9 C) or greater.  You develop worsening pain at or near the incision site. MAKE SURE YOU:   Understand these instructions.  Will watch your condition.  Will get help right away if you are not doing well or get worse. Document Released: 05/08/2006 Document Revised: 03/20/2011 Document Reviewed: 07/09/2006 ExitCare Patient Information 2014 ExitCare, [place].   ________________________________________________________________________  WHAT IS A BLOOD TRANSFUSION? Blood Transfusion Information  A transfusion is the replacement of blood or some of its parts. Blood is made up of multiple cells which provide different functions.  Red blood cells carry oxygen and are used for blood loss replacement.  White blood cells fight against infection.  Platelets control bleeding.  Plasma helps clot blood.  Other blood products are available for  specialized needs, such as hemophilia or other clotting disorders. BEFORE THE TRANSFUSION  Who gives blood for transfusions?   Healthy volunteers who are fully evaluated to make sure their blood is safe. This is blood bank blood. Transfusion therapy is the safest it has ever been in the practice of medicine. Before blood is taken from a donor, a complete history is taken to make sure that person has no history of diseases nor engages in risky social behavior (examples are intravenous drug use or sexual activity with multiple partners). The donor's travel history is screened to minimize risk of transmitting infections, such as malaria. The donated blood is tested for signs of infectious diseases, such as HIV and hepatitis. The blood is then tested to be sure it is compatible with you in order to minimize the chance of a transfusion reaction. If you or a relative donates blood, this is often done in anticipation of surgery and is not appropriate for emergency situations. It takes many days to process the donated blood. RISKS AND COMPLICATIONS Although transfusion therapy is very safe and saves many lives, the main dangers of transfusion include:   Getting an infectious disease.  Developing a transfusion reaction. This is an allergic reaction to something in the blood you were given. Every precaution is taken to prevent this. The decision to have a blood transfusion has been considered carefully by your caregiver before blood is given. Blood is not given unless the benefits outweigh the risks. AFTER THE TRANSFUSION  Right after receiving a blood transfusion, you will usually feel much better and more energetic. This is especially true if your red blood cells have gotten low (anemic). The transfusion raises the level of the red blood cells which carry oxygen, and this usually causes an energy increase.  The nurse administering the transfusion will monitor you carefully for complications. HOME CARE  INSTRUCTIONS  No special instructions are needed after a transfusion. You may find your energy is better. Speak with your caregiver about any limitations on activity for underlying diseases you may have. SEEK MEDICAL CARE IF:   Your condition is not improving after your transfusion.  You develop redness or irritation at the intravenous (IV) site. SEEK IMMEDIATE MEDICAL CARE IF:  Any of the following symptoms occur over the next 12 hours:  Shaking chills.  You have a temperature by mouth above 102 F (38.9 C), not controlled by medicine.  Chest, back, or muscle pain.  People around you feel you are not acting correctly or are confused.  Shortness of breath or difficulty breathing.  Dizziness and fainting.  You get a rash or develop hives.  You have a decrease in urine output.  Your urine turns a dark color or changes to pink, red, or brown. Any of the following symptoms occur over the next 10 days:  You have a temperature by mouth above 102 F (38.9 C), not controlled by medicine.  Shortness of breath.  Weakness after normal activity.  The white part of the eye turns yellow (jaundice).  You have a decrease in the amount of urine or are urinating less often.  Your urine turns a dark color or changes to pink, red, or brown. Document Released: 12/24/1999 Document Revised: 03/20/2011 Document Reviewed: 08/12/2007 [facility] Patient Information 2014 [place], [place].  _______________________________________________________________________

## 2017-01-26 NOTE — Progress Notes (Signed)
EKG 12-27-16 EPIC

## 2017-01-29 ENCOUNTER — Encounter (HOSPITAL_COMMUNITY)
Admission: RE | Admit: 2017-01-29 | Discharge: 2017-01-29 | Disposition: A | Discharge status: Home / Self Care | Payer: Medicare Other | Source: Ambulatory Visit | Attending: General Surgery | Admitting: General Surgery

## 2017-01-29 ENCOUNTER — Encounter (INDEPENDENT_AMBULATORY_CARE_PROVIDER_SITE_OTHER): Payer: Self-pay

## 2017-01-29 ENCOUNTER — Other Ambulatory Visit: Payer: Self-pay

## 2017-01-29 ENCOUNTER — Encounter (HOSPITAL_COMMUNITY): Payer: Self-pay

## 2017-01-29 DIAGNOSIS — E1122 Type 2 diabetes mellitus with diabetic chronic kidney disease: Secondary | ICD-10-CM | POA: Diagnosis not present

## 2017-01-29 DIAGNOSIS — Z01812 Encounter for preprocedural laboratory examination: Secondary | ICD-10-CM | POA: Diagnosis not present

## 2017-01-29 DIAGNOSIS — Z794 Long term (current) use of insulin: Secondary | ICD-10-CM | POA: Insufficient documentation

## 2017-01-29 DIAGNOSIS — K319 Disease of stomach and duodenum, unspecified: Secondary | ICD-10-CM | POA: Diagnosis not present

## 2017-01-29 DIAGNOSIS — N183 Chronic kidney disease, stage 3 (moderate): Secondary | ICD-10-CM | POA: Diagnosis not present

## 2017-01-29 DIAGNOSIS — Z79899 Other long term (current) drug therapy: Secondary | ICD-10-CM | POA: Insufficient documentation

## 2017-01-29 DIAGNOSIS — C55 Malignant neoplasm of uterus, part unspecified: Secondary | ICD-10-CM | POA: Insufficient documentation

## 2017-01-29 DIAGNOSIS — Z0183 Encounter for blood typing: Secondary | ICD-10-CM | POA: Diagnosis not present

## 2017-01-29 DIAGNOSIS — I129 Hypertensive chronic kidney disease with stage 1 through stage 4 chronic kidney disease, or unspecified chronic kidney disease: Secondary | ICD-10-CM | POA: Insufficient documentation

## 2017-01-29 HISTORY — DX: Other diseases of stomach and duodenum: K31.89

## 2017-01-29 LAB — URINALYSIS, ROUTINE W REFLEX MICROSCOPIC
Bilirubin Urine: NEGATIVE
GLUCOSE, UA: NEGATIVE mg/dL
KETONES UR: NEGATIVE mg/dL
NITRITE: NEGATIVE
PH: 5 (ref 5.0–8.0)
PROTEIN: NEGATIVE mg/dL
Specific Gravity, Urine: 1.017 (ref 1.005–1.030)

## 2017-01-29 LAB — COMPREHENSIVE METABOLIC PANEL
ALBUMIN: 2.9 g/dL — AB (ref 3.5–5.0)
ALK PHOS: 114 U/L (ref 38–126)
ALT: 13 U/L — AB (ref 14–54)
AST: 29 U/L (ref 15–41)
Anion gap: 9 (ref 5–15)
BILIRUBIN TOTAL: 0.4 mg/dL (ref 0.3–1.2)
BUN: 20 mg/dL (ref 6–20)
CALCIUM: 9.6 mg/dL (ref 8.9–10.3)
CO2: 25 mmol/L (ref 22–32)
CREATININE: 1.52 mg/dL — AB (ref 0.44–1.00)
Chloride: 101 mmol/L (ref 101–111)
GFR calc Af Amer: 36 mL/min — ABNORMAL LOW (ref >60)
GFR, EST NON AFRICAN AMERICAN: 31 mL/min — AB (ref >60)
GLUCOSE: 158 mg/dL — AB (ref 65–99)
Potassium: 4.3 mmol/L (ref 3.5–5.1)
Sodium: 135 mmol/L (ref 135–145)
TOTAL PROTEIN: 8 g/dL (ref 6.5–8.1)

## 2017-01-29 LAB — HEMOGLOBIN A1C
Hgb A1c MFr Bld: 7.5 % — ABNORMAL HIGH (ref 4.8–5.6)
Mean Plasma Glucose: 168.55 mg/dL

## 2017-01-29 LAB — CBC
HEMATOCRIT: 29.6 % — AB (ref 36.0–46.0)
HEMOGLOBIN: 9.4 g/dL — AB (ref 12.0–15.0)
MCH: 27.3 pg (ref 26.0–34.0)
MCHC: 31.8 g/dL (ref 30.0–36.0)
MCV: 86 fL (ref 78.0–100.0)
Platelets: 532 10*3/uL — ABNORMAL HIGH (ref 150–400)
RBC: 3.44 MIL/uL — ABNORMAL LOW (ref 3.87–5.11)
RDW: 15.7 % — ABNORMAL HIGH (ref 11.5–15.5)
WBC: 14 10*3/uL — AB (ref 4.0–10.5)

## 2017-01-29 LAB — PROTIME-INR
INR: 1.13
PROTHROMBIN TIME: 14.4 s (ref 11.4–15.2)

## 2017-01-29 LAB — GLUCOSE, CAPILLARY: Glucose-Capillary: 144 mg/dL — ABNORMAL HIGH (ref 65–99)

## 2017-01-29 LAB — APTT: APTT: 32 s (ref 24–36)

## 2017-01-29 NOTE — Progress Notes (Signed)
hemaglobin A1c ua, cmet and cbc results routed to [facility] cross np by epic

## 2017-01-29 NOTE — Progress Notes (Signed)
Spoke with michelle and made aware ruouted ua results to [facility] cross np, she will make melissa cross no aware in sma

## 2017-02-05 NOTE — Progress Notes (Signed)
Son of Mrs. Jamil came by Pre-Admissions requesting to pick up a bottle of her wash as she lost her wash and instructions day of pre-op appointment on 01/29/2017. Son given bottle of chlorhexidine wash and instructions for day of surgery.

## 2017-02-06 ENCOUNTER — Encounter (HOSPITAL_COMMUNITY)
Admission: RE | Disposition: A | Discharge status: Home / Self Care | Payer: Self-pay | Source: Ambulatory Visit | Attending: Gynecologic Oncology

## 2017-02-06 ENCOUNTER — Encounter (HOSPITAL_COMMUNITY): Payer: Self-pay | Admitting: *Deleted

## 2017-02-06 ENCOUNTER — Inpatient Hospital Stay: Admit: 2017-02-06 | Payer: Medicare Other | Admitting: General Surgery

## 2017-02-06 ENCOUNTER — Inpatient Hospital Stay (HOSPITAL_COMMUNITY): Payer: Medicare Other | Admitting: Anesthesiology

## 2017-02-06 ENCOUNTER — Inpatient Hospital Stay (HOSPITAL_COMMUNITY)
Admission: RE | Admit: 2017-02-06 | Discharge: 2017-02-16 | DRG: 739 | Disposition: A | Discharge status: Home / Self Care | Payer: Medicare Other | Source: Ambulatory Visit | Attending: Gynecologic Oncology | Admitting: Gynecologic Oncology

## 2017-02-06 DIAGNOSIS — D630 Anemia in neoplastic disease: Secondary | ICD-10-CM | POA: Diagnosis present

## 2017-02-06 DIAGNOSIS — E860 Dehydration: Secondary | ICD-10-CM | POA: Diagnosis not present

## 2017-02-06 DIAGNOSIS — R000 Tachycardia, unspecified: Secondary | ICD-10-CM | POA: Diagnosis not present

## 2017-02-06 DIAGNOSIS — K5909 Other constipation: Secondary | ICD-10-CM | POA: Diagnosis present

## 2017-02-06 DIAGNOSIS — K5901 Slow transit constipation: Secondary | ICD-10-CM

## 2017-02-06 DIAGNOSIS — N710 Acute inflammatory disease of uterus: Secondary | ICD-10-CM | POA: Diagnosis present

## 2017-02-06 DIAGNOSIS — I129 Hypertensive chronic kidney disease with stage 1 through stage 4 chronic kidney disease, or unspecified chronic kidney disease: Secondary | ICD-10-CM | POA: Diagnosis present

## 2017-02-06 DIAGNOSIS — R12 Heartburn: Secondary | ICD-10-CM | POA: Diagnosis not present

## 2017-02-06 DIAGNOSIS — N183 Chronic kidney disease, stage 3 unspecified: Secondary | ICD-10-CM

## 2017-02-06 DIAGNOSIS — C49A2 Gastrointestinal stromal tumor of stomach: Secondary | ICD-10-CM | POA: Diagnosis present

## 2017-02-06 DIAGNOSIS — R112 Nausea with vomiting, unspecified: Secondary | ICD-10-CM

## 2017-02-06 DIAGNOSIS — R188 Other ascites: Secondary | ICD-10-CM | POA: Diagnosis not present

## 2017-02-06 DIAGNOSIS — Y838 Other surgical procedures as the cause of abnormal reaction of the patient, or of later complication, without mention of misadventure at the time of the procedure: Secondary | ICD-10-CM | POA: Diagnosis not present

## 2017-02-06 DIAGNOSIS — C775 Secondary and unspecified malignant neoplasm of intrapelvic lymph nodes: Secondary | ICD-10-CM | POA: Diagnosis present

## 2017-02-06 DIAGNOSIS — Z794 Long term (current) use of insulin: Secondary | ICD-10-CM

## 2017-02-06 DIAGNOSIS — K567 Ileus, unspecified: Secondary | ICD-10-CM | POA: Diagnosis not present

## 2017-02-06 DIAGNOSIS — R34 Anuria and oliguria: Secondary | ICD-10-CM

## 2017-02-06 DIAGNOSIS — K659 Peritonitis, unspecified: Secondary | ICD-10-CM | POA: Diagnosis not present

## 2017-02-06 DIAGNOSIS — C541 Malignant neoplasm of endometrium: Principal | ICD-10-CM | POA: Diagnosis present

## 2017-02-06 DIAGNOSIS — R739 Hyperglycemia, unspecified: Secondary | ICD-10-CM

## 2017-02-06 DIAGNOSIS — R339 Retention of urine, unspecified: Secondary | ICD-10-CM | POA: Diagnosis not present

## 2017-02-06 DIAGNOSIS — R058 Other specified cough: Secondary | ICD-10-CM

## 2017-02-06 DIAGNOSIS — E875 Hyperkalemia: Secondary | ICD-10-CM | POA: Diagnosis not present

## 2017-02-06 DIAGNOSIS — E1129 Type 2 diabetes mellitus with other diabetic kidney complication: Secondary | ICD-10-CM | POA: Diagnosis present

## 2017-02-06 DIAGNOSIS — D62 Acute posthemorrhagic anemia: Secondary | ICD-10-CM | POA: Diagnosis not present

## 2017-02-06 DIAGNOSIS — E1122 Type 2 diabetes mellitus with diabetic chronic kidney disease: Secondary | ICD-10-CM | POA: Diagnosis present

## 2017-02-06 DIAGNOSIS — C549 Malignant neoplasm of corpus uteri, unspecified: Secondary | ICD-10-CM | POA: Diagnosis present

## 2017-02-06 DIAGNOSIS — Z8711 Personal history of peptic ulcer disease: Secondary | ICD-10-CM

## 2017-02-06 DIAGNOSIS — Z87891 Personal history of nicotine dependence: Secondary | ICD-10-CM

## 2017-02-06 DIAGNOSIS — K3189 Other diseases of stomach and duodenum: Secondary | ICD-10-CM | POA: Diagnosis present

## 2017-02-06 DIAGNOSIS — E1165 Type 2 diabetes mellitus with hyperglycemia: Secondary | ICD-10-CM | POA: Diagnosis present

## 2017-02-06 DIAGNOSIS — D72829 Elevated white blood cell count, unspecified: Secondary | ICD-10-CM | POA: Diagnosis not present

## 2017-02-06 DIAGNOSIS — K30 Functional dyspepsia: Secondary | ICD-10-CM

## 2017-02-06 DIAGNOSIS — D638 Anemia in other chronic diseases classified elsewhere: Secondary | ICD-10-CM | POA: Diagnosis present

## 2017-02-06 DIAGNOSIS — K9189 Other postprocedural complications and disorders of digestive system: Secondary | ICD-10-CM | POA: Diagnosis not present

## 2017-02-06 DIAGNOSIS — Z978 Presence of other specified devices: Secondary | ICD-10-CM

## 2017-02-06 DIAGNOSIS — D649 Anemia, unspecified: Secondary | ICD-10-CM

## 2017-02-06 DIAGNOSIS — Z9889 Other specified postprocedural states: Secondary | ICD-10-CM

## 2017-02-06 DIAGNOSIS — E876 Hypokalemia: Secondary | ICD-10-CM

## 2017-02-06 DIAGNOSIS — R05 Cough: Secondary | ICD-10-CM | POA: Diagnosis not present

## 2017-02-06 HISTORY — PX: LAPAROTOMY: SHX154

## 2017-02-06 HISTORY — PX: LYMPH NODE DISSECTION: SHX5087

## 2017-02-06 HISTORY — PX: LAPAROSCOPIC PARTIAL GASTRECTOMY: SHX5908

## 2017-02-06 LAB — GLUCOSE, CAPILLARY
GLUCOSE-CAPILLARY: 199 mg/dL — AB (ref 65–99)
Glucose-Capillary: 267 mg/dL — ABNORMAL HIGH (ref 65–99)
Glucose-Capillary: 284 mg/dL — ABNORMAL HIGH (ref 65–99)
Glucose-Capillary: 282 mg/dL — ABNORMAL HIGH (ref 65–99)
Glucose-Capillary: 286 mg/dL — ABNORMAL HIGH (ref 65–99)

## 2017-02-06 LAB — CBC
HEMATOCRIT: 23.5 % — AB (ref 36.0–46.0)
HEMOGLOBIN: 7.6 g/dL — AB (ref 12.0–15.0)
MCH: 27.5 pg (ref 26.0–34.0)
MCHC: 32.3 g/dL (ref 30.0–36.0)
MCV: 85.1 fL (ref 78.0–100.0)
Platelets: 460 10*3/uL — ABNORMAL HIGH (ref 150–400)
RBC: 2.76 MIL/uL — AB (ref 3.87–5.11)
RDW: 15.2 % (ref 11.5–15.5)
WBC: 11.6 10*3/uL — ABNORMAL HIGH (ref 4.0–10.5)

## 2017-02-06 LAB — PREPARE RBC (CROSSMATCH)

## 2017-02-06 SURGERY — LAPAROSCOPIC PARTIAL GASTRECTOMY
Anesthesia: General

## 2017-02-06 SURGERY — LAPAROTOMY, EXPLORATORY
Anesthesia: General | Site: Abdomen

## 2017-02-06 MED ORDER — LABETALOL HCL 5 MG/ML IV SOLN
INTRAVENOUS | Status: DC | PRN
  Administered 2017-02-06 (×2): 5 mg via INTRAVENOUS

## 2017-02-06 MED ORDER — OXYCODONE HCL 5 MG PO TABS
5.0000 mg | ORAL_TABLET | ORAL | Status: DC | PRN
  Administered 2017-02-06 – 2017-02-09 (×9): 5 mg via ORAL
  Filled 2017-02-06 (×11): qty 1

## 2017-02-06 MED ORDER — CHLORHEXIDINE GLUCONATE CLOTH 2 % EX PADS: 6.0000 | MEDICATED_PAD | Freq: Once | CUTANEOUS | Status: DC

## 2017-02-06 MED ORDER — FENTANYL CITRATE (PF) 100 MCG/2ML IJ SOLN
INTRAMUSCULAR | Status: DC | PRN
  Administered 2017-02-06 (×2): 25 ug via INTRAVENOUS
  Administered 2017-02-06: 50 ug via INTRAVENOUS
  Administered 2017-02-06 (×2): 25 ug via INTRAVENOUS
  Administered 2017-02-06: 50 ug via INTRAVENOUS

## 2017-02-06 MED ORDER — ENOXAPARIN SODIUM 30 MG/0.3ML ~~LOC~~ SOLN
30.0000 mg | SUBCUTANEOUS | Status: DC
  Administered 2017-02-07 – 2017-02-10 (×4): 30 mg via SUBCUTANEOUS
  Filled 2017-02-06 (×4): qty 0.3

## 2017-02-06 MED ORDER — INSULIN ASPART 100 UNIT/ML ~~LOC~~ SOLN
SUBCUTANEOUS | Status: AC
  Filled 2017-02-06: qty 1

## 2017-02-06 MED ORDER — ACETAMINOPHEN 500 MG PO TABS
1000.0000 mg | ORAL_TABLET | Freq: Two times a day (BID) | ORAL | Status: DC
  Administered 2017-02-07 – 2017-02-09 (×4): 1000 mg via ORAL
  Filled 2017-02-06 (×4): qty 2

## 2017-02-06 MED ORDER — BUPIVACAINE HCL (PF) 0.5 % IJ SOLN
INTRAMUSCULAR | Status: DC | PRN
  Administered 2017-02-06: 20 mL

## 2017-02-06 MED ORDER — SODIUM CHLORIDE 0.9 % IR SOLN
Status: DC | PRN
  Administered 2017-02-06: 8000 mL

## 2017-02-06 MED ORDER — ALBUMIN HUMAN 5 % IV SOLN
INTRAVENOUS | Status: AC
  Filled 2017-02-06: qty 250

## 2017-02-06 MED ORDER — INSULIN DETEMIR 100 UNIT/ML ~~LOC~~ SOLN
16.0000 [IU] | Freq: Every day | SUBCUTANEOUS | Status: DC
  Administered 2017-02-06 – 2017-02-12 (×4): 16 [IU] via SUBCUTANEOUS
  Filled 2017-02-06 (×8): qty 0.16

## 2017-02-06 MED ORDER — ONDANSETRON HCL 4 MG/2ML IJ SOLN
INTRAMUSCULAR | Status: DC | PRN
  Administered 2017-02-06: 4 mg via INTRAVENOUS

## 2017-02-06 MED ORDER — ROCURONIUM BROMIDE 50 MG/5ML IV SOSY
PREFILLED_SYRINGE | INTRAVENOUS | Status: AC
  Filled 2017-02-06: qty 5

## 2017-02-06 MED ORDER — LACTATED RINGERS IV SOLN
INTRAVENOUS | Status: DC
  Administered 2017-02-06 (×2): via INTRAVENOUS

## 2017-02-06 MED ORDER — BUPIVACAINE HCL (PF) 0.5 % IJ SOLN
INTRAMUSCULAR | Status: AC
  Filled 2017-02-06: qty 30

## 2017-02-06 MED ORDER — PIPERACILLIN-TAZOBACTAM 3.375 G IVPB
3.3750 g | Freq: Three times a day (TID) | INTRAVENOUS | Status: DC
  Administered 2017-02-06 – 2017-02-08 (×5): 3.375 g via INTRAVENOUS
  Filled 2017-02-06 (×6): qty 50

## 2017-02-06 MED ORDER — DEXAMETHASONE SODIUM PHOSPHATE 10 MG/ML IJ SOLN
INTRAMUSCULAR | Status: AC
  Filled 2017-02-06: qty 1

## 2017-02-06 MED ORDER — CHEWING GUM (ORBIT) SUGAR FREE
1.0000 | CHEWING_GUM | Freq: Three times a day (TID) | ORAL | Status: AC
  Administered 2017-02-07 – 2017-02-09 (×6): 1 via ORAL
  Filled 2017-02-06 (×2): qty 1

## 2017-02-06 MED ORDER — LIDOCAINE 2% (20 MG/ML) 5 ML SYRINGE
INTRAMUSCULAR | Status: AC
  Filled 2017-02-06: qty 5

## 2017-02-06 MED ORDER — HYDROMORPHONE HCL 1 MG/ML IJ SOLN
0.2500 mg | INTRAMUSCULAR | Status: DC | PRN
  Administered 2017-02-06 (×2): 0.25 mg via INTRAVENOUS
  Administered 2017-02-06: 0.5 mg via INTRAVENOUS

## 2017-02-06 MED ORDER — BUPIVACAINE-EPINEPHRINE (PF) 0.5% -1:200000 IJ SOLN
INTRAMUSCULAR | Status: AC
  Filled 2017-02-06: qty 30

## 2017-02-06 MED ORDER — HYDROMORPHONE HCL 1 MG/ML IJ SOLN
INTRAMUSCULAR | Status: AC
  Filled 2017-02-06: qty 1

## 2017-02-06 MED ORDER — INSULIN DETEMIR 100 UNIT/ML ~~LOC~~ SOLN: 10.0000 [IU] | Freq: Once | SUBCUTANEOUS | Status: DC

## 2017-02-06 MED ORDER — NON FORMULARY: 1.0000 [IU] | Freq: Three times a day (TID) | Status: DC

## 2017-02-06 MED ORDER — FENTANYL CITRATE (PF) 100 MCG/2ML IJ SOLN
INTRAMUSCULAR | Status: AC
  Filled 2017-02-06: qty 2

## 2017-02-06 MED ORDER — SUCCINYLCHOLINE CHLORIDE 200 MG/10ML IV SOSY
PREFILLED_SYRINGE | INTRAVENOUS | Status: AC
  Filled 2017-02-06: qty 10

## 2017-02-06 MED ORDER — LACTATED RINGERS IV SOLN
INTRAVENOUS | Status: DC | PRN
  Administered 2017-02-06 (×2): via INTRAVENOUS

## 2017-02-06 MED ORDER — PROMETHAZINE HCL 25 MG/ML IJ SOLN: 6.2500 mg | INTRAMUSCULAR | Status: DC | PRN

## 2017-02-06 MED ORDER — SODIUM CHLORIDE 0.9 % IV SOLN
Freq: Once | INTRAVENOUS | Status: AC
  Administered 2017-02-06: 17:00:00 via INTRAVENOUS

## 2017-02-06 MED ORDER — CEFAZOLIN SODIUM-DEXTROSE 2-4 GM/100ML-% IV SOLN
INTRAVENOUS | Status: AC
  Filled 2017-02-06: qty 100

## 2017-02-06 MED ORDER — HEMOSTATIC AGENTS (NO CHARGE) OPTIME
TOPICAL | Status: DC | PRN
  Administered 2017-02-06: 2 via TOPICAL

## 2017-02-06 MED ORDER — TRAMADOL HCL 50 MG PO TABS
100.0000 mg | ORAL_TABLET | Freq: Two times a day (BID) | ORAL | Status: DC | PRN
  Administered 2017-02-07 (×2): 100 mg via ORAL
  Filled 2017-02-06 (×3): qty 2

## 2017-02-06 MED ORDER — DIPHENHYDRAMINE HCL 50 MG/ML IJ SOLN
25.0000 mg | Freq: Once | INTRAMUSCULAR | Status: AC
  Administered 2017-02-06: 16:00:00 25 mg via INTRAVENOUS
  Filled 2017-02-06: qty 1

## 2017-02-06 MED ORDER — PREGABALIN 25 MG PO CAPS
25.0000 mg | ORAL_CAPSULE | Freq: Two times a day (BID) | ORAL | Status: DC
  Administered 2017-02-07 – 2017-02-08 (×4): 25 mg via ORAL
  Filled 2017-02-06 (×5): qty 1

## 2017-02-06 MED ORDER — LABETALOL HCL 5 MG/ML IV SOLN
INTRAVENOUS | Status: AC
  Filled 2017-02-06: qty 4

## 2017-02-06 MED ORDER — ALBUMIN HUMAN 5 % IV SOLN
INTRAVENOUS | Status: DC | PRN
  Administered 2017-02-06 (×2): via INTRAVENOUS

## 2017-02-06 MED ORDER — GABAPENTIN 300 MG PO CAPS
300.0000 mg | ORAL_CAPSULE | ORAL | Status: AC
  Administered 2017-02-06: 300 mg via ORAL
  Filled 2017-02-06: qty 1

## 2017-02-06 MED ORDER — KCL IN DEXTROSE-NACL 20-5-0.45 MEQ/L-%-% IV SOLN
INTRAVENOUS | Status: DC
  Administered 2017-02-06: 20:00:00 via INTRAVENOUS
  Filled 2017-02-06: qty 1000

## 2017-02-06 MED ORDER — INSULIN ASPART 100 UNIT/ML ~~LOC~~ SOLN
0.0000 [IU] | Freq: Three times a day (TID) | SUBCUTANEOUS | Status: DC
  Administered 2017-02-06: 5 [IU] via SUBCUTANEOUS
  Administered 2017-02-07: 08:00:00 11 [IU] via SUBCUTANEOUS

## 2017-02-06 MED ORDER — ONDANSETRON HCL 4 MG/2ML IJ SOLN
4.0000 mg | Freq: Four times a day (QID) | INTRAMUSCULAR | Status: DC | PRN
  Administered 2017-02-06 – 2017-02-09 (×2): 4 mg via INTRAVENOUS
  Filled 2017-02-06 (×2): qty 2

## 2017-02-06 MED ORDER — INSULIN ASPART 100 UNIT/ML ~~LOC~~ SOLN
8.0000 [IU] | Freq: Once | SUBCUTANEOUS | Status: AC
  Administered 2017-02-06: 8 [IU] via SUBCUTANEOUS

## 2017-02-06 MED ORDER — SUGAMMADEX SODIUM 200 MG/2ML IV SOLN
INTRAVENOUS | Status: AC
  Filled 2017-02-06: qty 2

## 2017-02-06 MED ORDER — LIDOCAINE 2% (20 MG/ML) 5 ML SYRINGE
INTRAMUSCULAR | Status: DC | PRN
  Administered 2017-02-06: 1 mg/kg/h via INTRAVENOUS

## 2017-02-06 MED ORDER — PROPOFOL 10 MG/ML IV BOLUS
INTRAVENOUS | Status: DC | PRN
  Administered 2017-02-06: 120 mg via INTRAVENOUS

## 2017-02-06 MED ORDER — ENOXAPARIN SODIUM 40 MG/0.4ML ~~LOC~~ SOLN
40.0000 mg | SUBCUTANEOUS | Status: AC
  Administered 2017-02-06: 40 mg via SUBCUTANEOUS
  Filled 2017-02-06: qty 0.4

## 2017-02-06 MED ORDER — OXYCODONE HCL 5 MG PO TABS: 5.0000 mg | ORAL_TABLET | Freq: Once | ORAL | Status: DC | PRN

## 2017-02-06 MED ORDER — SUGAMMADEX SODIUM 200 MG/2ML IV SOLN
INTRAVENOUS | Status: DC | PRN
  Administered 2017-02-06: 200 mg via INTRAVENOUS

## 2017-02-06 MED ORDER — ONDANSETRON HCL 4 MG/2ML IJ SOLN
INTRAMUSCULAR | Status: AC
  Filled 2017-02-06: qty 2

## 2017-02-06 MED ORDER — ROCURONIUM BROMIDE 50 MG/5ML IV SOSY
PREFILLED_SYRINGE | INTRAVENOUS | Status: DC | PRN
  Administered 2017-02-06: 5 mg via INTRAVENOUS
  Administered 2017-02-06: 50 mg via INTRAVENOUS
  Administered 2017-02-06: 5 mg via INTRAVENOUS

## 2017-02-06 MED ORDER — PROPOFOL 10 MG/ML IV BOLUS
INTRAVENOUS | Status: AC
  Filled 2017-02-06: qty 20

## 2017-02-06 MED ORDER — LIDOCAINE 2% (20 MG/ML) 5 ML SYRINGE
INTRAMUSCULAR | Status: AC
  Filled 2017-02-06: qty 10

## 2017-02-06 MED ORDER — BUPIVACAINE LIPOSOME 1.3 % IJ SUSP
20.0000 mL | Freq: Once | INTRAMUSCULAR | Status: DC
  Filled 2017-02-06: qty 20

## 2017-02-06 MED ORDER — ACETAMINOPHEN 325 MG PO TABS
650.0000 mg | ORAL_TABLET | Freq: Once | ORAL | Status: AC
  Administered 2017-02-06: 650 mg via ORAL
  Filled 2017-02-06: qty 2

## 2017-02-06 MED ORDER — OXYCODONE HCL 5 MG/5ML PO SOLN
5.0000 mg | Freq: Once | ORAL | Status: DC | PRN
  Filled 2017-02-06: qty 5

## 2017-02-06 MED ORDER — ONDANSETRON HCL 4 MG PO TABS
4.0000 mg | ORAL_TABLET | Freq: Four times a day (QID) | ORAL | Status: DC | PRN
  Administered 2017-02-08: 4 mg via ORAL
  Filled 2017-02-06: qty 1

## 2017-02-06 MED ORDER — HYDROMORPHONE HCL 1 MG/ML IJ SOLN
0.5000 mg | INTRAMUSCULAR | Status: AC | PRN
  Administered 2017-02-06 (×2): 0.5 mg via INTRAVENOUS
  Filled 2017-02-06 (×2): qty 0.5

## 2017-02-06 MED ORDER — SENNOSIDES-DOCUSATE SODIUM 8.6-50 MG PO TABS
2.0000 | ORAL_TABLET | Freq: Every day | ORAL | Status: DC
  Administered 2017-02-06 – 2017-02-08 (×3): 2 via ORAL
  Filled 2017-02-06 (×3): qty 2

## 2017-02-06 MED ORDER — DEXAMETHASONE SODIUM PHOSPHATE 10 MG/ML IJ SOLN
INTRAMUSCULAR | Status: DC | PRN
  Administered 2017-02-06: 10 mg via INTRAVENOUS

## 2017-02-06 MED ORDER — ACETAMINOPHEN 500 MG PO TABS
1000.0000 mg | ORAL_TABLET | ORAL | Status: AC
  Administered 2017-02-06: 1000 mg via ORAL
  Filled 2017-02-06: qty 2

## 2017-02-06 MED ORDER — INSULIN DETEMIR 100 UNIT/ML ~~LOC~~ SOLN: 16.0000 [IU] | Freq: Every day | SUBCUTANEOUS | Status: DC

## 2017-02-06 MED ORDER — SODIUM CHLORIDE 0.9 % IJ SOLN
INTRAMUSCULAR | Status: AC
  Filled 2017-02-06: qty 10

## 2017-02-06 MED ORDER — LIDOCAINE 2% (20 MG/ML) 5 ML SYRINGE
INTRAMUSCULAR | Status: DC | PRN
  Administered 2017-02-06: 80 mg via INTRAVENOUS

## 2017-02-06 MED ORDER — CEFAZOLIN SODIUM-DEXTROSE 2-4 GM/100ML-% IV SOLN
2.0000 g | INTRAVENOUS | Status: AC
  Administered 2017-02-06: 2 g via INTRAVENOUS
  Filled 2017-02-06: qty 100

## 2017-02-06 MED ORDER — SODIUM CHLORIDE 0.9 % IJ SOLN
INTRAMUSCULAR | Status: DC | PRN
  Administered 2017-02-06: 20 mL

## 2017-02-06 SURGICAL SUPPLY — 117 items
APPLIER CLIP ROT 10 11.4 M/L (STAPLE) ×4
ATTRACTOMAT 16X20 MAGNETIC DRP (DRAPES) ×4 IMPLANT
BLADE CLIPPER SURG (BLADE) IMPLANT
BLADE EXTENDED COATED 6.5IN (ELECTRODE) ×4 IMPLANT
CABLE HIGH FREQUENCY MONO STRZ (ELECTRODE) ×4 IMPLANT
CATH MALLECOT 28FR (CATHETERS) IMPLANT
CELLS DAT CNTRL 66122 CELL SVR (MISCELLANEOUS) IMPLANT
CHLORAPREP W/TINT 26ML (MISCELLANEOUS) ×8 IMPLANT
CLIP APPLIE ROT 10 11.4 M/L (STAPLE) ×3 IMPLANT
CLIP TI LARGE 6 (CLIP) ×4 IMPLANT
CLIP VESOCCLUDE LG 6/CT (CLIP) ×4 IMPLANT
CLIP VESOCCLUDE MED 6/CT (CLIP) ×8 IMPLANT
CLIP VESOCCLUDE MED LG 6/CT (CLIP) ×4 IMPLANT
CONT SPEC 4OZ CLIKSEAL STRL BL (MISCELLANEOUS) IMPLANT
COVER MAYO STAND STRL (DRAPES) ×4 IMPLANT
DERMABOND ADVANCED (GAUZE/BANDAGES/DRESSINGS) ×1
DERMABOND ADVANCED .7 DNX12 (GAUZE/BANDAGES/DRESSINGS) ×3 IMPLANT
DRAIN CHANNEL 19F RND (DRAIN) IMPLANT
DRAPE INCISE IOBAN 66X45 STRL (DRAPES) IMPLANT
DRAPE LAPAROSCOPIC ABDOMINAL (DRAPES) ×4 IMPLANT
DRAPE UTILITY XL STRL (DRAPES) IMPLANT
DRAPE WARM FLUID 44X44 (DRAPE) ×8 IMPLANT
DRSG OPSITE POSTOP 4X10 (GAUZE/BANDAGES/DRESSINGS) ×4 IMPLANT
DRSG OPSITE POSTOP 4X6 (GAUZE/BANDAGES/DRESSINGS) IMPLANT
DRSG OPSITE POSTOP 4X8 (GAUZE/BANDAGES/DRESSINGS) IMPLANT
ELECT PENCIL ROCKER SW 15FT (MISCELLANEOUS) IMPLANT
ELECT REM PT RETURN 15FT ADLT (MISCELLANEOUS) ×8 IMPLANT
ENDOLOOP SUT PDS II  0 18 (SUTURE) ×1
ENDOLOOP SUT PDS II 0 18 (SUTURE) ×3 IMPLANT
EVACUATOR DRAINAGE 10X20 100CC (DRAIN) ×3 IMPLANT
EVACUATOR SILICONE 100CC (DRAIN) ×1 IMPLANT
FLOSEAL 10ML (HEMOSTASIS) ×8 IMPLANT
GAUZE SPONGE 4X4 12PLY STRL (GAUZE/BANDAGES/DRESSINGS) IMPLANT
GAUZE SPONGE 4X4 16PLY XRAY LF (GAUZE/BANDAGES/DRESSINGS) IMPLANT
GLOVE BIO SURGEON STRL SZ 6 (GLOVE) ×12 IMPLANT
GLOVE BIO SURGEON STRL SZ 6.5 (GLOVE) ×8 IMPLANT
GLOVE BIOGEL PI IND STRL 6.5 (GLOVE) ×3 IMPLANT
GLOVE BIOGEL PI INDICATOR 6.5 (GLOVE) ×1
GOWN STRL REUS W/ TWL LRG LVL3 (GOWN DISPOSABLE) ×6 IMPLANT
GOWN STRL REUS W/ TWL XL LVL3 (GOWN DISPOSABLE) ×9 IMPLANT
GOWN STRL REUS W/TWL 2XL LVL3 (GOWN DISPOSABLE) ×4 IMPLANT
GOWN STRL REUS W/TWL LRG LVL3 (GOWN DISPOSABLE) ×14 IMPLANT
GOWN STRL REUS W/TWL XL LVL3 (GOWN DISPOSABLE) ×3
HEMOSTAT ARISTA ABSORB 3G PWDR (MISCELLANEOUS) IMPLANT
HEMOSTAT SURGICEL 2X14 (HEMOSTASIS) IMPLANT
KIT BASIN OR (CUSTOM PROCEDURE TRAY) ×8 IMPLANT
L-HOOK LAP DISP 36CM (ELECTROSURGICAL) ×4
LHOOK LAP DISP 36CM (ELECTROSURGICAL) ×3 IMPLANT
LIGASURE IMPACT 36 18CM CVD LR (INSTRUMENTS) IMPLANT
LOOP VESSEL MAXI BLUE (MISCELLANEOUS) ×4 IMPLANT
LOOP VESSEL MINI RED (MISCELLANEOUS) ×3 IMPLANT
NEEDLE HYPO 22GX1.5 SAFETY (NEEDLE) ×8 IMPLANT
NS IRRIG 1000ML POUR BTL (IV SOLUTION) ×16 IMPLANT
PACK GENERAL/GYN (CUSTOM PROCEDURE TRAY) ×4 IMPLANT
PAD POSITIONING PINK XL (MISCELLANEOUS) IMPLANT
POSITIONER SURGICAL ARM (MISCELLANEOUS) IMPLANT
POUCH SPECIMEN RETRIEVAL 10MM (ENDOMECHANICALS) ×8 IMPLANT
RELOAD PROXIMATE 75MM BLUE (ENDOMECHANICALS) IMPLANT
RELOAD PROXIMATE TA60MM BLUE (ENDOMECHANICALS) IMPLANT
RELOAD STAPLER BLUE 60MM (STAPLE) ×6 IMPLANT
RELOAD STAPLER GREEN 60MM (STAPLE) IMPLANT
RELOAD STAPLER WHITE 60MM (STAPLE) IMPLANT
RETRACTOR WND ALEXIS 25 LRG (MISCELLANEOUS) IMPLANT
RTRCTR WOUND ALEXIS 18CM MED (MISCELLANEOUS)
RTRCTR WOUND ALEXIS 25CM LRG (MISCELLANEOUS)
SCALPEL HARMONIC ACE (MISCELLANEOUS) IMPLANT
SCISSORS METZENBAUM CVD 33 (INSTRUMENTS) ×4 IMPLANT
SEALANT SURGICAL APPL DUAL CAN (MISCELLANEOUS) IMPLANT
SEALER TISSUE X1 CVD JAW (INSTRUMENTS) ×4 IMPLANT
SET IRRIG TUBING LAPAROSCOPIC (IRRIGATION / IRRIGATOR) IMPLANT
SHEARS HARMONIC ACE PLUS 36CM (ENDOMECHANICALS) ×4 IMPLANT
SHEET LAVH (DRAPES) ×4 IMPLANT
SLEEVE ENDOPATH XCEL 5M (ENDOMECHANICALS) ×8 IMPLANT
SLEEVE XCEL OPT CAN 5 100 (ENDOMECHANICALS) ×4 IMPLANT
SPOGE SURGIFLO 8M (HEMOSTASIS)
SPONGE LAP 18X18 X RAY DECT (DISPOSABLE) ×4 IMPLANT
SPONGE SURGIFLO 8M (HEMOSTASIS) IMPLANT
STAPLE ECHEON FLEX 60 POW ENDO (STAPLE) ×4 IMPLANT
STAPLER 90 3.5 STAND SLIM (STAPLE) ×4
STAPLER 90 3.5 STD SLIM (STAPLE) ×3 IMPLANT
STAPLER GUN LINEAR PROX 60 (STAPLE) IMPLANT
STAPLER PROXIMATE 75MM BLUE (STAPLE) IMPLANT
STAPLER RELOAD BLUE 60MM (STAPLE) ×8
STAPLER RELOAD GREEN 60MM (STAPLE)
STAPLER RELOAD WHITE 60MM (STAPLE)
STAPLER VISISTAT 35W (STAPLE) IMPLANT
SUT MNCRL AB 4-0 PS2 18 (SUTURE) ×12 IMPLANT
SUT PDS AB 1 TP1 96 (SUTURE) ×8 IMPLANT
SUT PROLENE 5 0 CC 1 (SUTURE) ×8 IMPLANT
SUT SILK 2 0 SH CR/8 (SUTURE) IMPLANT
SUT SILK 3 0 SH CR/8 (SUTURE) IMPLANT
SUT VIC AB 0 CT1 36 (SUTURE) ×28 IMPLANT
SUT VIC AB 2-0 CT1 36 (SUTURE) ×16 IMPLANT
SUT VIC AB 2-0 CT2 27 (SUTURE) ×44 IMPLANT
SUT VIC AB 2-0 SH 27 (SUTURE)
SUT VIC AB 2-0 SH 27X BRD (SUTURE) IMPLANT
SUT VIC AB 3-0 CTX 36 (SUTURE) IMPLANT
SUT VIC AB 3-0 SH 18 (SUTURE) IMPLANT
SUT VIC AB 3-0 SH 27 (SUTURE) ×6
SUT VIC AB 3-0 SH 27X BRD (SUTURE) ×18 IMPLANT
SYR 30ML LL (SYRINGE) ×8 IMPLANT
SYS LAPSCP GELPORT 120MM (MISCELLANEOUS)
SYSTEM LAPSCP GELPORT 120MM (MISCELLANEOUS) IMPLANT
TAPE CLOTH 4X10 WHT NS (GAUZE/BANDAGES/DRESSINGS) IMPLANT
TOWEL OR 17X26 10 PK STRL BLUE (TOWEL DISPOSABLE) ×8 IMPLANT
TOWEL OR NON WOVEN STRL DISP B (DISPOSABLE) ×8 IMPLANT
TRAY FOLEY W/METER SILVER 16FR (SET/KITS/TRAYS/PACK) ×8 IMPLANT
TRAY LAPAROSCOPIC (CUSTOM PROCEDURE TRAY) ×4 IMPLANT
TROCAR XCEL 12X100 BLDLESS (ENDOMECHANICALS) IMPLANT
TROCAR XCEL BLUNT TIP 100MML (ENDOMECHANICALS) ×4 IMPLANT
TUBE MOSS GAS 18FR (TUBING) ×4 IMPLANT
TUBING INSUF HEATED (TUBING) ×4 IMPLANT
TUNNELER SHEATH ON-Q 16GX12 DP (PAIN MANAGEMENT) IMPLANT
UNDERPAD 30X30 (UNDERPADS AND DIAPERS) ×4 IMPLANT
VESSEL LOOPS MINI RED (MISCELLANEOUS) ×1
YANKAUER SUCT BULB TIP 10FT TU (MISCELLANEOUS) ×4 IMPLANT
YANKAUER SUCT BULB TIP NO VENT (SUCTIONS) IMPLANT

## 2017-02-06 NOTE — H&P (Signed)
H&P Note: Gyn-Onc    Assessment/Plan:  [name]  is a 84 y.o.  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 She has recent endometritis with persistent elevation in WBC consistent with ongoing infection. This is likely secondary to necrotic tumor in the uterus. It will be best treated with hysterectomy and IV antibiotics. This active infection substantially increases her risk for SSI. Her poorly controlled diabetes mellitus also increases her risk for perioperative complications. This has been discussed with her, her daughter and her son.  She is agreeable to proceed with surgery.   HPI:[name] is an 84 year old P3 who was seen for a new patient consultation for uterine carcinosarcoma by me on 12/04/16.  The patient is a resident of the [place] (though her daughter lives in [place], [place] where she spends half her year). The patient developed some vaginal bleeding in October, 2018 and was admitted to a hospital in [place].  CT scan of the abdomen and pelvis on 10/25/16 showed normal lung bases, normal liver and spleen. A 3.5x3.5x4.9cm soft tissue mass was seen abutting the distal pancreasand arising from the fundus of the stomach "suggestive of gastroinstinal stromal tumor".  There was a cystic 4cm lesion in the right kidney (which on separate [place] appeared benign). The Uterus was markedly enlarged with a thickened endometrium at 3.7cm.  Endometrial biopsy on10/22/18 showed uterine carcinosarcoma. Pelvic US on 10/25/16 showed a uterus measuring 13x8.5x6.7cm with an endometrium that measured 3.6cm.  EGD on 10/26/16 showed a gastric ulcer which was biopsied and found to be benign.   Lab work from that hospitalization showed a WBC elevated to 15,000, with a hemoglobin of  10.2. Platelets were normal.  The white count normalized throughout there stay. But the Hb decreased to 8.3. On 10/25/16 her PTT was elevated at 15, and INR was 1.6 (the patient denies being treated with anticoagulants). Her creatinine was 1.9 and her glucose 219.  ALT was mildly elevated at 57.  HbA1C was very elevated at 9.8on 10/26/16. She has poorly controlled diabetes and admits to being noncompliant with meds. Her insulin was adjusted during that hospital stay.  Repeat labs here at [facility] on 12/04/16 were overall improved and showed a low albumin at 3.1 and elevated creatinine at 1.7. Her Hb was low (anemia) at 9.8 - likely secondary to bleeding from the tumor and anemia of chronic disease. Her LFT's were normal, as were her coags.  Her Hb A1 C was elevated at 8%.  Her CA 125 tumor marker was mildly elevated at 82.   Surgery was coordinated for January, 29th, 2019 as a combined procedure with Dr Barry Dienes for resection of the upper GI tumor with a planned preoperative EUS study to better delineate the mass.  In the intervening period she developed symptoms of abdominal pain and fevers and presented to the local ER in [facility] where she was noted to have a fever of 102. IV antibiotics were started and she was transferred to [facility] approximately 30 hours later.   Interval History: She was admitted for 48 hours and received IV antibiotics which normalized her WBC and fever curve.  She later received an EUS on 01/18/17 with findings consistent with a gastric GIST.  CA 125 is elevated to 82. Last Hb A1c was elevated at 7.5.  THe patient's  most recent WBC is elevated at 14, however she is afebrile at home.   Current Meds:  Current Facility-Administered Medications:  .  0.9 %  sodium chloride infusion, , Intravenous, Continuous, Ivor Costa, MD, Last Rate: 75 mL/hr at 12/27/16 1808 .  acetaminophen (TYLENOL) tablet 650 mg, 650 mg, Oral, Q6H PRN, Ivor Costa,  MD .  enoxaparin (LOVENOX) injection 30 mg, 30 mg, Subcutaneous, Q24H, Runyon, Amanda M, RPH .  hydrALAZINE (APRESOLINE) injection 5 mg, 5 mg, Intravenous, Q2H PRN, Ivor Costa, MD .  insulin aspart (novoLOG) injection 0-9 Units, 0-9 Units, Subcutaneous, TID WC, Ivor Costa, MD, 3 Units at 12/27/16 1803 .  insulin detemir (LEVEMIR) injection 15 Units, 15 Units, Subcutaneous, QHS, Ivor Costa, MD, 15 Units at 12/27/16 2129 .  lisinopril (PRINIVIL,ZESTRIL) tablet 20 mg, 20 mg, Oral, Daily, Ivor Costa, MD, 20 mg at 12/27/16 2128 .  morphine 4 MG/ML injection 0.52 mg, 0.52 mg, Intravenous, Q4H PRN, Ivor Costa, MD .  ondansetron (ZOFRAN) injection 4 mg, 4 mg, Intravenous, Q8H PRN, Ivor Costa, MD .  oxyCODONE-acetaminophen (PERCOCET/ROXICET) 5-325 MG per tablet 1 tablet, 1 tablet, Oral, Q4H PRN, Ivor Costa, MD .  pantoprazole (PROTONIX) EC tablet 40 mg, 40 mg, Oral, BID, Ivor Costa, MD, 40 mg at 12/27/16 2129 .  piperacillin-tazobactam (ZOSYN) IVPB 3.375 g, 3.375 g, Intravenous, Q8H, Dara Hoyer, RPH, Last Rate: 12.5 mL/hr at 12/28/16 0248, 3.375 g at 12/28/16 0248 .  simvastatin (ZOCOR) tablet 20 mg, 20 mg, Oral, q1800, Ivor Costa, MD, 20 mg at 12/27/16 1803 .  zolpidem (AMBIEN) tablet 5 mg, 5 mg, Oral, QHS PRN, Ivor Costa, MD   Allergy:       Allergies  Allergen Reactions  . Ibuprofen Other (See Comments)    Pt feels disoriented with medication     Social Hx:   Social History        Socioeconomic History  . Marital status: Married    Spouse name: Not on file  . Number of children: Not on file  . Years of education: Not on file  . Highest education level: Not on file  Social Needs  . Financial resource strain: Not on file  . Food insecurity - worry: Not on file  . Food insecurity - inability: Not on file  . Transportation needs - medical: Not on file  . Transportation needs - non-medical: Not on file  Occupational History  . Not on file  Tobacco Use  . Smoking  status: Former Smoker    Packs/day: 1.00    Types: Cigarettes  . Smokeless tobacco: Never Used  Substance and Sexual Activity  . Alcohol use: No    Frequency: Never    Comment: Sober quit drinking  . Drug use: No  . Sexual activity: No  Other Topics Concern  . Not on file  Social History Narrative  . Not on file    Past Surgical Hx:       Past Surgical History:  Procedure Laterality Date  . ENDOMETRIAL BIOPSY    . Gastric ulcer biopsy      Past Medical Hx:      Past Medical History:  Diagnosis Date  . Cataract   . CKD (chronic kidney disease), stage III ([place])   . Diabetes mellitus without complication ([place])   . Hypertension   . Renal insufficiency     Past Gynecological History:  Carcinosarcoma of the uterus. No LMP recorded. Patient is postmenopausal.  Family Hx:       Family  History  Problem Relation Age of Onset  . Headache Mother     Review of Systems:  Constitutional  Feels well,   ENT Normal appearing ears and nares bilaterally Skin/Breast  No rash, sores, jaundice, itching, dryness Cardiovascular  No chest pain, shortness of breath, or edema  Pulmonary  No cough or wheeze.  Gastro Intestinal  No nausea, vomitting, or diarrhoea. No bright red blood per rectum, no abdominal pain, change in bowel movement, or constipation.  Genito Urinary  No frequency, urgency, dysuria, + bleeding (stable, light) Musculo Skeletal  No myalgia, arthralgia, joint swelling or pain  Neurologic  No weakness, numbness, change in gait,  Psychology  No depression, anxiety, insomnia.   Vitals:  Blood pressure (!) 139/46, pulse 72, temperature 99.9 F (37.7 C), temperature source Oral, resp. rate 16, height 5\' 6"  (1.676 m), weight 169 lb 12.1 oz (77 kg), SpO2 96 %.  Physical Exam: WD in NAD Neck  Supple NROM, without any enlargements.  Lymph Node Survey No cervical supraclavicular or inguinal adenopathy Cardiovascular  Pulse normal  rate, regularity and rhythm. S1 and S2 normal.  Lungs  Clear to auscultation bilateraly, without wheezes/crackles/rhonchi. Good air movement.  Skin  No rash/lesions/breakdown  Psychiatry  Alert and oriented to person, place, and time  Abdomen  Normoactive bowel sounds, abdomen soft, snd obese without evidence of hernia. Tender uterine fundus to palpate. Back No CVA tenderness Genito Urinary  Vulva/vagina: Normal external female genitalia.  No lesions. No discharge or bleeding.             Bladder/urethra:  No lesions or masses, well supported bladder             Vagina: normal, no lesions, blood (brown) in vault             Cervix: dilated around 6cm uterine prolapsing mass             Uterus: Globularly enlarged to umbilicus, tender  Rectal  deferred.  Extremities  No bilateral cyanosis, clubbing or edema.  [name], MD

## 2017-02-06 NOTE — Op Note (Addendum)
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  Surgery: Total abdominal hysterectomy >250gm, upper vaginectomy, bilateral salpingo-oophorectomy, pelvic lymphadenectomy  Surgeons:  Everitt Amber, MD; Lahoma Crocker, MD   Anesthesia: General   Estimated blood loss: 800 ml  IVF: 3000 ml   Urine output: 245 ml   Complications: None   Pathology: Uterus, cervix, bilateral tubes and ovaries, upper vaginal tumor mass, right obturator lymph node  Operative findings:   Procedure: The patient was identified in the preoperative holding area. Informed consent was signed on the chart. Patient was seen history was reviewed and exam was performed.   The patient was then taken to the operating room and placed in the supine position with SCD hose on. General anesthesia was then induced without difficulty. She was then placed in the dorsolithotomy position. The abdomen was prepped with chlor prep sponges per protocol. Perineum was prepped with Betadine. The vagina was prepped with Betadine a Foley catheter was inserted into the bladder under sterile conditions.  The patient was then draped after the prep was dried. Timeout was performed the patient, procedure, antibiotic, allergy, and length of procedure.  Dr. Barry Dienes commenced the case and performed a partial gastrectomy as discussed in an alternative operative note.  After Dr. Barry Dienes had completed her portion of the procedure the hassan portsite was extended to a vertical midline infraumbilical incision which was carried down to the underlying fascia using Bovie cautery. The fascia was scored in the fascial incision was extended superiorly and inferiorly using Bovie cautery. The rectus bellies were dissected off the overlying fascia. The peritoneum was tented and entered. The peritoneal  incision was extended superiorly and inferiorly with visualization of the underlying peritoneal cavity. The Buchwalter self-retaining retractor was then placed. At the initial placement as well as at several points during the case the lateral blades were checked to ensure no significant pressure on the psoas bellies.   The small and large bowel were packed out of the way of the surgical field with moist laparotomy sponges and malleable retractors were attached to the [place]. The round ligament on the patient's right side was transected with monopolar cautery the anterior posterior leaves the broad ligament were opened. A window was made between the infundibulopelvic vessels and the ureter. The vessels were clamped x2 transected and suture ligated. The bladder flap was created at this point and the uterine vessels on the right side were skeletonized.  Our attention was turned to the left side were similar procedure was performed in that the round ligament was transected and the anterior and posterior leaves the broad ligament were opened. A window was made between the vessels and the ureter on the left and the vessels were clamped x2 transected and suture ligated.    The uterine vessels were clamped bilaterally at the uterine isthmus with curved Heaney clamps then the pedicles were transected and suture ligated.  This time necrotic tumor was spilling from the left uterine isthmus.  Palpably it is difficult to discern the cervicovaginal junction due to the dilated cervix and tumor erupting through the cervical awes.  Therefore a vaginal sponge stick was placed in order to delineate the upper vagina.  The Bovie was used to incise transversely in the upper vagina to enter the vagina anteriorly.  Curved Heaney clamps were placed retrograde through the cardinal ligaments and uterosacral ligaments bilaterally transected and suture-ligated.  The  posterior vaginal colic colpotomy was created sharply.  The specimen  was sent for permanent pathology.  The large fragments of necrotic tumor were spilling out of the uterus into the pelvis.  These were all manually removed.   Inspection of the upper vagina revealed a 2 cm tumor mass infiltrating the posterior left upper vagina.  Using the Bovie this was carefully resected so that there was no gross visible residual vaginal disease present.  It was sent as a separate specimen.  This vaginal excision was closed with 0 Vicryl in running suture.  The vaginal cuff was closed with interrupted 0 Vicryl figure-of-eight sutures.  Right-sided ureteral lysis was necessary to mobilize the ureter away from the uterine artery at the isthmus and again at the origin of the uterine artery where there was bleeding.  Bleeding was made hemostatic with 3-0 Vicryl suture in figure of 8.  The ureter was closely inspected and no surgical clips suture or the bovie char were present on the ureter.  Copious irrigation of the pelvis with 5 L of warmed saline was performed due to the contamination of the pelvis with necrotic infected tumor.  The pelvic lymphadenectomy was performed on the right by opening up the paravesical space parallel to the superior vesicle artery and median umbilical ligament.  This was held open with a retractor.  The retractors were positioned to reflect the ureter on the right medially and open up the right pararectal space.  A bulky 4 cm right obturator lymph node which was grossly involved by tumor was apparent.  Using the Bovie and Metzenbaum scissors this was sharply dissected from its attachments to the external iliac vein on the right.  The obturator nerve on the right was carefully identified and spared during the dissection.  During the dissection there was bleeding from the ventral surface of the proximal external iliac vein.  This was made hemostatic with a figure-of-eight 5-0 Prolene suture.  FloSeal was placed in the surgical bed after removal of the node to reinforce  hemostasis.  Palpation of the periaortic lymph nodes confirmed clinically suspicious 1 cm para-aortic lymph nodes that are likely to be involved with tumor.  There was no apparent intraperitoneal residual tumor grossly visible.  The pedicles were noted to be hemostatic. The abdomen pelvis were copiously irrigated. The retractor and laparotomy sponges were removed. The fascia was closed using running mass closure of #1 PDS. The subcutaneous tissues were irrigated and made hemostatic. 20 mL of Exparel within 20 mL of normal saline was injected for postoperative pain control. The skin was closed using subcuticular suture and dermabond.  All instrument, suture, laparotomy, Ray-Tec, and needle counts were correct x2. The patient tolerated the procedure well and was taken recovery room in stable condition. This is Everitt Amber dictating an operative note on Martha Mcdaniel.

## 2017-02-06 NOTE — Op Note (Signed)
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 Denman George  PATIENT DISPOSITION:  PACU - hemodynamically stable.   FINDINGS:  Rubbery mass in proximal stomach approximately 4 cm, partially firm and calcified.    PROCEDURE:  Pt was identified in the holding area and taken to the operating room and placed supine on the operating room table.  General anesthesia was induced.  The patient was placed in low lithotomy position for the TAH.  The abdomen was prepped and draped in sterile fashion. The perineum and vagina was prepped per Dr. Denman George and foley was placed sterilely.  Timeout was performed according to the surgical safety checklist.  When all was correct, we continued.    The patient was placed in reverse Trendelenberg position and rotated to the left. An infraumbilical incision around 1.6 cm in length was made with a #11 blade.  The Claiborne Billings was used to spread the subcutaneous tissues.  Two Kochers were used to elevate the fascia.  The fascia was incised with the #11 blade.  A 0-0 vicryl pursestring suture was placed around the fascial incision.  A Hasson trocar was advanced into the abdomen and held in place with the tails of the suture.  Pneumoperitoneum was achieved to a pressure of 15 mm Hg.  Two 5 mm trocars were placed with one in the right upper quadrant and one in the LUQ.    The mass was not immediately visible.  The short gastric arteries were taken down with the harmonic scalpel.  Care was taken to avoid the spleen.  The  stomach was mobilized beyond the mass.  The mass was visible and was exophytic.   Two fires of the Echelon stapler were used to divide the portion of the stomach with the mass from the stomach.  There was no bleeding at the staple line.  The mass was retrieved with an endocatch bag via the 12 mm port.  The incision had to be extended a small amount.  This was examined to make sure the gross margin was negative.    The left upper quadrant was reexamined for hemostasis.  There was no evidence of bleeding.   The patient was left with Dr. Denman George for continuation of the TAH/BSO and lymphadenectomy.  She will dictate the remainder of the case including closure of incisions.  Pt was stable at the time of my departure.

## 2017-02-06 NOTE — Progress Notes (Signed)
Patient unable to perform Incentive Spirometer due to grogginess from IV benadryl and nausea. Will pass on to night shift nurse.

## 2017-02-06 NOTE — Anesthesia Preprocedure Evaluation (Signed)
Anesthesia Evaluation  Patient identified by MRN, date of birth, ID band Patient awake    Reviewed: Allergy & Precautions, H&P , NPO status , Patient's Chart, lab work & pertinent test results  Airway Mallampati: II   Neck ROM: full    Dental   Pulmonary former smoker,    breath sounds clear to auscultation       Cardiovascular hypertension, Pt. on medications  Rhythm:regular Rate:Normal     Neuro/Psych    GI/Hepatic PUD,   Endo/Other  diabetes, Type 2, Insulin Dependent  Renal/GU Renal InsufficiencyRenal disease     Musculoskeletal  (+) Arthritis , Osteoarthritis,    Abdominal   Peds  Hematology  (+) anemia ,   Anesthesia Other Findings   Reproductive/Obstetrics                             Anesthesia Physical  Anesthesia Plan  ASA: III  Anesthesia Plan: General   Post-op Pain Management:    Induction: Intravenous  PONV Risk Score and Plan: 3 and Treatment may vary due to age or medical condition, Ondansetron, Dexamethasone and Midazolam  Airway Management Planned: Oral ETT  Additional Equipment:   Intra-op Plan:   Post-operative Plan: Extubation in OR  Informed Consent: I have reviewed the patients History and Physical, chart, labs and discussed the procedure including the risks, benefits and alternatives for the proposed anesthesia with the patient or authorized representative who has indicated his/her understanding and acceptance.     Plan Discussed with: CRNA, Anesthesiologist and Surgeon  Anesthesia Plan Comments:         Anesthesia Quick Evaluation

## 2017-02-06 NOTE — H&P (Signed)
Martha Mcdaniel  Location: [facility] Patient #: [id number] DOB: 1933-08-18 Married / Language: English / Race: Black or African American Female   History of Present Illness The patient is a 84 year old female who presents with a complaint of Mass. Pt is an 84 yo F who presented to medical care in October 2018 in the [place] for vaginal bleeding. She had a CT there showing a 3.5 x 4.9 cm mass butting the distal pancreas wtih GIST favored. She also had an enlarged uterus. She had a negative EGD. She denies issues with nausea/vomiting/weight loss/early satiety. She is not having upper abdominal pain. She had continued to have vaginal bleeding. endometrial bx was positive for carcinosarcoma of the uterus. She is referred by Dr. Denman George to coordinate surgical care. She gets around well and walks daily. She has diabetes and has an elevated creatinine, but denies significant neuropathy or visual changes.   I have reviewed images from CD and report which was with the CD.    Past Surgical History  Cataract Surgery  Left.  Diagnostic Studies History Colonoscopy  never Mammogram  never Pap Smear  1-5 years ago  Allergies   No Known Drug Allergies 12/19/2016  Medication History  Insulin Detemir (100UNIT/ML Solution, Subcutaneous) Active. Insulin NPH Isophane & Regular ((70-30) 100UNIT/ML Suspension, Subcutaneous) Active. Lisinopril (20MG  Tablet, Oral) Active. Levemir FlexTouch (100UNIT/ML Soln Pen-inj, Subcutaneous) Active. Simvastatin (20MG  Tablet, Oral) Active. Medications Reconciled  Social History  Alcohol use  Occasional alcohol use. Caffeine use  Coffee. No drug use  Tobacco use  Former smoker.  Family History  Family history unknown  First Degree Relatives   Pregnancy / Birth History Age at menarche  38 years. Age of menopause  32-50 Gravida  5 Length (months) of breastfeeding  3-6 Maternal age  28-20 Para  3  Other  Problems  Arthritis  Back Pain  Diabetes Mellitus  High blood pressure  Hypercholesterolemia     Review of Systems General Present- Appetite Loss and Weight Loss. Not Present- Chills, Fatigue, Fever, Night Sweats and Weight Gain. Skin Present- Change in Wart/Mole and Dryness. Not Present- Hives, Jaundice, New Lesions, Non-Healing Wounds, Rash and Ulcer. HEENT Present- Visual Disturbances and Wears glasses/contact lenses. Not Present- Earache, Hearing Loss, Hoarseness, Nose Bleed, Oral Ulcers, Ringing in the Ears, Seasonal Allergies, Sinus Pain, Sore Throat and Yellow Eyes. Gastrointestinal Present- Abdominal Pain. Not Present- Bloating, Bloody Stool, Change in Bowel Habits, Chronic diarrhea, Constipation, Difficulty Swallowing, Excessive gas, Gets full quickly at meals, Hemorrhoids, Indigestion, Nausea, Rectal Pain and Vomiting. Female Genitourinary Present- Frequency. Not Present- Nocturia, Painful Urination, Pelvic Pain and Urgency. Musculoskeletal Present- Back Pain, Joint Pain and Muscle Weakness. Not Present- Joint Stiffness, Muscle Pain and Swelling of Extremities. Neurological Present- Decreased Memory, Numbness, Tingling and Weakness. Not Present- Fainting, Headaches, Seizures, Tremor and Trouble walking. Psychiatric Present- Fearful and Frequent crying. Not Present- Anxiety, Bipolar, Change in Sleep Pattern and Depression. Endocrine Present- Cold Intolerance and New Diabetes. Not Present- Excessive Hunger, Hair Changes, Heat Intolerance and Hot flashes. Hematology Present- Excessive bleeding. Not Present- Blood Thinners, Easy Bruising, Gland problems, HIV and Persistent Infections.  Vitals  Weight: 169.31 lb Height: 65in Body Surface Area: 1.84 m Body Mass Index: 28.17 kg/m  Temp.: 98.27F(Oral)  Pulse: 95 (Regular)  P.OX: 2% (Room air) BP: 136/60 (Sitting, Left Arm, Standard)       Physical Exam  General Mental Status-Alert. General  Appearance-Consistent with stated age. Hydration-Well hydrated. Voice-Normal.  Head and Neck Head-normocephalic, atraumatic  with no lesions or palpable masses. Trachea-midline. Thyroid Gland Characteristics - normal size and consistency.  Eye Eyeball - Bilateral-Extraocular movements intact. Sclera/Conjunctiva - Bilateral-No scleral icterus.  Chest and Lung Exam Chest and lung exam reveals -quiet, even and easy respiratory effort with no use of accessory muscles and on auscultation, normal breath sounds, no adventitious sounds and normal vocal resonance. Inspection Chest Wall - Normal. Back - normal.  Cardiovascular Cardiovascular examination reveals -normal heart sounds, regular rate and rhythm with no murmurs and normal pedal pulses bilaterally.  Abdomen Inspection Inspection of the abdomen reveals - No Hernias. Palpation/Percussion Palpation and Percussion of the abdomen reveal - Soft, Non Tender, No Rebound tenderness, No Rigidity (guarding) and No hepatosplenomegaly. Auscultation Auscultation of the abdomen reveals - Bowel sounds normal.  Neurologic Neurologic evaluation reveals -alert and oriented x 3 with no impairment of recent or remote memory. Mental Status-Normal.  Musculoskeletal Global Assessment -Note: no gross deformities.  Normal Exam - Left-Upper Extremity Strength Normal and Lower Extremity Strength Normal. Normal Exam - Right-Upper Extremity Strength Normal and Lower Extremity Strength Normal.  Lymphatic Head & Neck  General Head & Neck Lymphatics: Bilateral - Description - Normal. Axillary  General Axillary Region: Bilateral - Description - Normal. Tenderness - Non Tender. Femoral & Inguinal  Generalized Femoral & Inguinal Lymphatics: Bilateral - Description - No Generalized lymphadenopathy.    Assessment & Plan  LEFT UPPER QUADRANT ABDOMINAL MASS (R19.02) Impression: This mass is most likely a GIST arising from  the stomach. I will refer her for EUS to confirm, as a pancreatic mass/cancer would change her surgical plans significantly as well as change her prognosis. As long as this is the case, would plan laparoscopic partial gastrectomy in conjunction wtih Dr. Denman George. I would plan to go first in order to maximize the pneumoperitoneum. I would place my 12 mm port in the midline at the site of Dr. Serita Grit planned laparotomy incision. After removing the specimen, it would be placed in a bag and then could be removed via the laparotomy incision.  I discussed risks with her and her children, both of whom are present. I reviewed the risk of bleeding, infection, damage to adjacent structures, clot, heart/lung complications, prolonged nausea, leak of staple line, and more.  I have written orders and will get this on the books with Dr. Denman George. Current Plans Referred to Gastroenterology, for evaluation and follow up Physiological scientist). Routine. Pt Education - [facility] Free Text Education/Instructions: discussed with patient and provided information. You are being scheduled for surgery- Our schedulers will call you.  You should hear from our office's scheduling department within 5 working days about the location, date, and time of surgery. We try to make accommodations for patient's preferences in scheduling surgery, but sometimes the OR schedule or the surgeon's schedule prevents [place] from making those accommodations.  If you have not heard from our office [phone]) in 5 working days, call the office and ask for your surgeon's nurse.  If you have other questions about your diagnosis, plan, or surgery, call the office and ask for your surgeon's nurse.    Signed by Stark Klein, MD

## 2017-02-06 NOTE — Interval H&P Note (Signed)
History and Physical Interval Note:  02/06/2017 7:32 AM  Martha Mcdaniel  has presented today for surgery, with the diagnosis of SARCOMA OF THE UTERUS, GASTRIC MASS  The various methods of treatment have been discussed with the patient and family. After consideration of risks, benefits and other options for treatment, the patient has consented to  Procedure(s): EXPLORATORY LAPAROTOMY (N/A) HYSTERECTOMY ABDOMINAL WITH SALPINGO-OOPHORECTOMY (Bilateral) LYMPHADENECTOMY (N/A) LAPAROSCOPIC PARTIAL GASTRECTOMY (N/A) as a surgical intervention .  The patient's history has been reviewed, patient examined, no change in status, stable for surgery.  I have reviewed the patient's chart and labs.  Questions were answered to the patient's satisfaction.     Stark Klein

## 2017-02-06 NOTE — Anesthesia Procedure Notes (Signed)
Procedure Name: Intubation Date/Time: 02/06/2017 8:14 AM Performed by: Dione Booze, CRNA Pre-anesthesia Checklist: Suction available, Patient being monitored, Emergency Drugs available and Patient identified Patient Re-evaluated:Patient Re-evaluated prior to induction Oxygen Delivery Method: Circle system utilized Preoxygenation: Pre-oxygenation with 100% oxygen Induction Type: IV induction Ventilation: Mask ventilation without difficulty Laryngoscope Size: Mac and 3 Grade View: Grade I Tube type: Oral Tube size: 7.5 mm Number of attempts: 1 Airway Equipment and Method: Stylet Placement Confirmation: ETT inserted through vocal cords under direct vision,  positive ETCO2 and breath sounds checked- equal and bilateral Secured at: 21 cm Tube secured with: Tape Dental Injury: Teeth and Oropharynx as per pre-operative assessment

## 2017-02-06 NOTE — Transfer of Care (Signed)
Immediate Anesthesia Transfer of Care Note  Patient: Martha Mcdaniel  Procedure(s) Performed: EXPLORATORY LAPAROTOMY (N/A Abdomen) HYSTERECTOMY ABDOMINAL WITH SALPINGO-OOPHORECTOMY (Bilateral Abdomen) LYMPHADENECTOMY (N/A Abdomen) LAPAROSCOPIC PARTIAL GASTRECTOMY (N/A )  Patient Location: PACU  Anesthesia Type:General  Level of Consciousness: drowsy and patient cooperative  Airway & Oxygen Therapy: Patient Spontanous Breathing and Patient connected to face mask oxygen  Post-op Assessment: Report given to RN and Post -op Vital signs reviewed and stable  Post vital signs: Reviewed and stable  Last Vitals:  Vitals:   02/06/17 0556  BP: (!) 111/99  Pulse: (!) 109  Resp: 18  Temp: 36.9 C  SpO2: 96%    Last Pain:  Vitals:   02/06/17 0556  TempSrc: Oral         Complications: No apparent anesthesia complications

## 2017-02-06 NOTE — Progress Notes (Signed)
Pharmacy Antibiotic Note  Dima Mini is a 84 y.o. 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  Pharmacy has been consulted for Zosyn dosing for possible intra-abdominal infection  Plan:  Zosyn 3.375 g IV given once over 30 minutes, then every 8 hrs by 4-hr infusion  Pharmacy will sigh off but follow peripherally for culture results and renal adjustments, as patient is near threshold to reduce dose.  Height: 5\' 4"  (162.6 cm) Weight: 152 lb (68.9 kg) IBW/kg (Calculated) : 54.7  Temp (24hrs), Avg:97.8 F (36.6 C), Min:97.2 F (36.2 C), Max:98.6 F (37 C)  Recent Labs  Lab 02/06/17 1218  WBC 11.6*    Estimated Creatinine Clearance: 27.7 mL/min (A) (by C-G formula based on SCr of 1.52 mg/dL (H)).    Allergies  Allergen Reactions  . Ibuprofen Other (See Comments)    Pt feels disoriented with medication       Thank you for allowing pharmacy to be a part of this patient's care.  Reuel Boom, PharmD, BCPS [phone] 02/06/2017, 8:14 PM

## 2017-02-07 ENCOUNTER — Other Ambulatory Visit: Payer: Self-pay

## 2017-02-07 ENCOUNTER — Encounter (HOSPITAL_COMMUNITY): Payer: Self-pay | Admitting: Gynecologic Oncology

## 2017-02-07 LAB — BASIC METABOLIC PANEL
ANION GAP: 8 (ref 5–15)
ANION GAP: 11 (ref 5–15)
BUN: 33 mg/dL — ABNORMAL HIGH (ref 6–20)
BUN: 32 mg/dL — AB (ref 6–20)
CHLORIDE: 101 mmol/L (ref 101–111)
CHLORIDE: 100 mmol/L — AB (ref 101–111)
CO2: 23 mmol/L (ref 22–32)
CO2: 21 mmol/L — AB (ref 22–32)
Calcium: 8.6 mg/dL — ABNORMAL LOW (ref 8.9–10.3)
Calcium: 8.7 mg/dL — ABNORMAL LOW (ref 8.9–10.3)
Creatinine, Ser: 1.6 mg/dL — ABNORMAL HIGH (ref 0.44–1.00)
Creatinine, Ser: 1.62 mg/dL — ABNORMAL HIGH (ref 0.44–1.00)
GFR calc Af Amer: 33 mL/min — ABNORMAL LOW (ref >60)
GFR calc non Af Amer: 29 mL/min — ABNORMAL LOW (ref >60)
GFR calc non Af Amer: 29 mL/min — ABNORMAL LOW (ref >60)
GFR, EST AFRICAN AMERICAN: 34 mL/min — AB (ref >60)
GLUCOSE: 334 mg/dL — AB (ref 65–99)
GLUCOSE: 167 mg/dL — AB (ref 65–99)
POTASSIUM: 5.1 mmol/L (ref 3.5–5.1)
Potassium: 6 mmol/L — ABNORMAL HIGH (ref 3.5–5.1)
Sodium: 132 mmol/L — ABNORMAL LOW (ref 135–145)
Sodium: 132 mmol/L — ABNORMAL LOW (ref 135–145)

## 2017-02-07 LAB — CBC
HCT: 26 % — ABNORMAL LOW (ref 36.0–46.0)
HEMATOCRIT: 24 % — AB (ref 36.0–46.0)
HEMOGLOBIN: 7.9 g/dL — AB (ref 12.0–15.0)
Hemoglobin: 8.5 g/dL — ABNORMAL LOW (ref 12.0–15.0)
MCH: 27.3 pg (ref 26.0–34.0)
MCH: 27.6 pg (ref 26.0–34.0)
MCHC: 32.7 g/dL (ref 30.0–36.0)
MCHC: 32.9 g/dL (ref 30.0–36.0)
MCV: 83.6 fL (ref 78.0–100.0)
MCV: 83.9 fL (ref 78.0–100.0)
Platelets: 425 10*3/uL — ABNORMAL HIGH (ref 150–400)
Platelets: 490 10*3/uL — ABNORMAL HIGH (ref 150–400)
RBC: 3.11 MIL/uL — AB (ref 3.87–5.11)
RBC: 2.86 MIL/uL — AB (ref 3.87–5.11)
RDW: 15.4 % (ref 11.5–15.5)
RDW: 15.4 % (ref 11.5–15.5)
WBC: 17.4 10*3/uL — ABNORMAL HIGH (ref 4.0–10.5)
WBC: 18.6 10*3/uL — AB (ref 4.0–10.5)

## 2017-02-07 LAB — GLUCOSE, CAPILLARY
GLUCOSE-CAPILLARY: 135 mg/dL — AB (ref 65–99)
Glucose-Capillary: 302 mg/dL — ABNORMAL HIGH (ref 65–99)
Glucose-Capillary: 192 mg/dL — ABNORMAL HIGH (ref 65–99)
Glucose-Capillary: 105 mg/dL — ABNORMAL HIGH (ref 65–99)

## 2017-02-07 MED ORDER — SODIUM CHLORIDE 0.9 % IV BOLUS (SEPSIS)
500.0000 mL | Freq: Once | INTRAVENOUS | Status: AC
  Administered 2017-02-07: 500 mL via INTRAVENOUS

## 2017-02-07 MED ORDER — LACTATED RINGERS IV SOLN: INTRAVENOUS | Status: DC

## 2017-02-07 MED ORDER — INSULIN ASPART 100 UNIT/ML ~~LOC~~ SOLN: 0.0000 [IU] | Freq: Every day | SUBCUTANEOUS | Status: DC

## 2017-02-07 MED ORDER — INSULIN ASPART 100 UNIT/ML ~~LOC~~ SOLN
0.0000 [IU] | Freq: Three times a day (TID) | SUBCUTANEOUS | Status: DC
  Administered 2017-02-07: 17:00:00 2 [IU] via SUBCUTANEOUS
  Administered 2017-02-07 – 2017-02-08 (×3): 4 [IU] via SUBCUTANEOUS

## 2017-02-07 MED ORDER — SODIUM CHLORIDE 0.9 % IV BOLUS (SEPSIS): 500.0000 mL | Freq: Once | INTRAVENOUS | Status: DC

## 2017-02-07 MED ORDER — SODIUM CHLORIDE 0.9 % IV SOLN
INTRAVENOUS | Status: DC
  Administered 2017-02-07 – 2017-02-11 (×4): via INTRAVENOUS

## 2017-02-07 NOTE — Progress Notes (Signed)
1 Day Post-Op   Subjective/Chief Complaint: No nausea.  Pain improved since this Am.     Objective: Vital signs in last 24 hours: Temp:  [96.8 F (36 C)-98.6 F (37 C)] 96.8 F (36 C) (01/30 0930) Pulse Rate:  [78-95] 78 (01/30 0930) Resp:  [13-16] 16 (01/30 0930) BP: (95-174)/(56-108) 126/57 (01/30 0930) SpO2:  [100 %] 100 % (01/30 0930) Last BM Date: 02/05/17  Intake/Output from previous day: 01/29 0701 - 01/30 0700 In: 5286.7 [P.O.:960; I.V.:3276.7; Blood:450; IV Piggyback:600] Out: 1700 [Urine:900; Blood:800] Intake/Output this shift: Total I/O In: -  Out: 250 [Urine:250]  General appearance: alert, cooperative and no distress Resp: breathing comfortably GI: soft, mildly distended.  dressings c/d/i.   Extremities: extremities normal, atraumatic, no cyanosis or edema  Lab Results:  Recent Labs    02/06/17 1218 02/07/17 0557  WBC 11.6* 17.4*  HGB 7.6* 8.5*  HCT 23.5* 26.0*  PLT 460* 425*   BMET Recent Labs    02/07/17 0557  NA 132*  K 6.0*  CL 101  CO2 23  GLUCOSE 334*  BUN 33*  CREATININE 1.60*  CALCIUM 8.6*   PT/INR No results for input(s): LABPROT, INR in the last 72 hours. ABG No results for input(s): PHART, HCO3 in the last 72 hours.  Invalid input(s): PCO2, PO2  Studies/Results: No results found.  Anti-infectives: Anti-infectives (From admission, onward)   Start     Dose/Rate Route Frequency Ordered Stop   02/06/17 2100  piperacillin-tazobactam (ZOSYN) IVPB 3.375 g     3.375 g 12.5 mL/hr over 240 Minutes Intravenous Every 8 hours 02/06/17 2011     02/06/17 0627  ceFAZolin (ANCEF) 2-4 GM/100ML-% IVPB    Comments:  Dione Booze   : cabinet override      02/06/17 0627 02/06/17 0827   02/06/17 0555  ceFAZolin (ANCEF) IVPB 2g/100 mL premix     2 g 200 mL/hr over 30 Minutes Intravenous On call to O.R. 02/06/17 9470 02/06/17 0842      Assessment/Plan: s/p Procedure(s): EXPLORATORY LAPAROTOMY (N/A) HYSTERECTOMY ABDOMINAL WITH  SALPINGO-OOPHORECTOMY (Bilateral) LYMPHADENECTOMY (N/A) LAPAROSCOPIC PARTIAL GASTRECTOMY (N/A) diet as tolerated.  Await pathology Change IVF since potassium elevated at 6.   Levimir added    LOS: 1 day    Stark Klein 02/07/2017

## 2017-02-07 NOTE — Progress Notes (Signed)
1 Day Post-Op Procedure(s) (LRB): EXPLORATORY LAPAROTOMY (N/A) HYSTERECTOMY ABDOMINAL WITH SALPINGO-OOPHORECTOMY (Bilateral) LYMPHADENECTOMY (N/A) LAPAROSCOPIC PARTIAL GASTRECTOMY (N/A)  Subjective: Patient reports pain of an 8 in the abdomen this am. Resting quietly in bed.  No nausea or emesis reported.  Up with assist.  States she has not urinated but per RN foley is still in place.  Denies chest pain, dyspnea, passing flatus, or having a bowel movement.  No concerns voiced.   Objective: Vital signs in last 24 hours: Temp:  [97.1 F (36.2 C)-98.6 F (37 C)] 97.1 F (36.2 C) (01/30 0509) Pulse Rate:  [81-99] 81 (01/30 0509) Resp:  [13-24] 16 (01/30 0509) BP: (95-174)/(56-108) 116/62 (01/30 0509) SpO2:  [100 %] 100 % (01/30 0509)    Intake/Output from previous day: 01/29 0701 - 01/30 0700 In: 5286.7 [P.O.:960; I.V.:3276.7; Blood:450; IV Piggyback:600] Out: 1700 [Urine:900; Blood:800]  Physical Examination: General: alert, cooperative and no distress Resp: clear to auscultation bilaterally Cardio: regular rate and rhythm, S1, S2 normal, no murmur, click, rub or gallop GI: incision: midline op site dressing intact with no evidence of drainage and abdomen soft, active bowel sounds, non-tympanic Extremities: extremities normal, atraumatic, no cyanosis or edema  Labs: WBC/Hgb/Hct/Plts:  17.4/8.5/26.0/425 (01/30 0557) BUN/Cr/glu/ALT/AST/amyl/lip:  33/1.60/--/--/--/--/-- (01/30 0557)  Assessment: 84 y.o. s/p Procedure(s): EXPLORATORY LAPAROTOMY HYSTERECTOMY ABDOMINAL WITH SALPINGO-OOPHORECTOMY LYMPHADENECTOMY LAPAROSCOPIC PARTIAL GASTRECTOMY: stable Pain:  Pain is well-controlled on PRN medications.  Heme: Hgb 8.5 and Hct 26.0 this am.  Stable post-op.  CV: BP and HR stable post-op.  Continue to monitor with ordered vital signs.  GI:  Tolerating po: Yes.  GU: Foley to be removed this am.  Adequate output reported.    FEN: Stable post-operatively.  Endo: Diabetes  mellitus Type II, under poor control..  CBG:  CBG (last 3)  Recent Labs    02/06/17 2154 02/06/17 2247 02/07/17 0723  GLUCAP 282* 286* 302*    Prophylaxis: pharmacologic prophylaxis (with any of the following: enoxaparin (Lovenox) 40mg  SQ 2 hours prior to surgery then every day) and intermittent pneumatic compression boots.  Plan: Adjust insulin sliding scale to resistant Diet to Carb mod Encourage IS use, deep breathing, and coughing Continue plan of care per Dr. Denman George   LOS: 1 day    Melissa D Cross 02/07/2017, 8:55 AM

## 2017-02-07 NOTE — Anesthesia Postprocedure Evaluation (Signed)
Anesthesia Post Note  Patient: Martha Mcdaniel  Procedure(s) Performed: EXPLORATORY LAPAROTOMY (N/A Abdomen) HYSTERECTOMY ABDOMINAL WITH SALPINGO-OOPHORECTOMY (Bilateral Abdomen) LYMPHADENECTOMY (N/A Abdomen) LAPAROSCOPIC PARTIAL GASTRECTOMY (N/A )     Anesthesia Post Evaluation  Last Vitals:  Vitals:   02/07/17 0152 02/07/17 0509  BP: 95/76 116/62  Pulse: 82 81  Resp: 16 16  Temp: (!) 36.4 C (!) 36.2 C  SpO2: 100% 100%    Last Pain:  Vitals:   02/07/17 0615  TempSrc:   PainSc: 5    Pain Goal: Patients Stated Pain Goal: 3 (02/07/17 0615)               Lynda Rainwater

## 2017-02-08 ENCOUNTER — Inpatient Hospital Stay (HOSPITAL_COMMUNITY): Payer: Medicare Other

## 2017-02-08 LAB — COMPREHENSIVE METABOLIC PANEL
ALK PHOS: 102 U/L (ref 38–126)
ALT: 11 U/L — ABNORMAL LOW (ref 14–54)
ANION GAP: 10 (ref 5–15)
AST: 21 U/L (ref 15–41)
Albumin: 2.6 g/dL — ABNORMAL LOW (ref 3.5–5.0)
BILIRUBIN TOTAL: 0.4 mg/dL (ref 0.3–1.2)
BUN: 31 mg/dL — ABNORMAL HIGH (ref 6–20)
CALCIUM: 9 mg/dL (ref 8.9–10.3)
CO2: 23 mmol/L (ref 22–32)
Chloride: 104 mmol/L (ref 101–111)
Creatinine, Ser: 1.81 mg/dL — ABNORMAL HIGH (ref 0.44–1.00)
GFR calc non Af Amer: 25 mL/min — ABNORMAL LOW (ref >60)
GFR, EST AFRICAN AMERICAN: 29 mL/min — AB (ref >60)
Glucose, Bld: 199 mg/dL — ABNORMAL HIGH (ref 65–99)
POTASSIUM: 4.6 mmol/L (ref 3.5–5.1)
SODIUM: 137 mmol/L (ref 135–145)
TOTAL PROTEIN: 7.1 g/dL (ref 6.5–8.1)

## 2017-02-08 LAB — CBC WITH DIFFERENTIAL/PLATELET
BASOS ABS: 0 10*3/uL (ref 0.0–0.1)
BASOS PCT: 0 %
Eosinophils Absolute: 0 10*3/uL (ref 0.0–0.7)
Eosinophils Relative: 0 %
HEMATOCRIT: 27 % — AB (ref 36.0–46.0)
HEMOGLOBIN: 8.7 g/dL — AB (ref 12.0–15.0)
LYMPHS PCT: 12 %
Lymphs Abs: 2 10*3/uL (ref 0.7–4.0)
MCH: 27.7 pg (ref 26.0–34.0)
MCHC: 32.2 g/dL (ref 30.0–36.0)
MCV: 86 fL (ref 78.0–100.0)
Monocytes Absolute: 1.3 10*3/uL — ABNORMAL HIGH (ref 0.1–1.0)
Monocytes Relative: 7 %
NEUTROS ABS: 14.1 10*3/uL — AB (ref 1.7–7.7)
NEUTROS PCT: 81 %
Platelets: 648 10*3/uL — ABNORMAL HIGH (ref 150–400)
RBC: 3.14 MIL/uL — ABNORMAL LOW (ref 3.87–5.11)
RDW: 15.5 % (ref 11.5–15.5)
WBC: 17.4 10*3/uL — ABNORMAL HIGH (ref 4.0–10.5)

## 2017-02-08 LAB — GLUCOSE, CAPILLARY
GLUCOSE-CAPILLARY: 115 mg/dL — AB (ref 65–99)
GLUCOSE-CAPILLARY: 188 mg/dL — AB (ref 65–99)
GLUCOSE-CAPILLARY: 184 mg/dL — AB (ref 65–99)
Glucose-Capillary: 154 mg/dL — ABNORMAL HIGH (ref 65–99)

## 2017-02-08 LAB — TROPONIN I: Troponin I: <0.03 ng/mL (ref <0.03)

## 2017-02-08 MED ORDER — BISACODYL 10 MG RE SUPP
10.0000 mg | Freq: Once | RECTAL | Status: AC
  Administered 2017-02-08: 10 mg via RECTAL
  Filled 2017-02-08: qty 1

## 2017-02-08 MED ORDER — FLEET ENEMA 7-19 GM/118ML RE ENEM
1.0000 | ENEMA | Freq: Once | RECTAL | Status: AC
  Administered 2017-02-08: 1 via RECTAL
  Filled 2017-02-08: qty 1

## 2017-02-08 NOTE — Progress Notes (Addendum)
2 Days Post-Op Procedure(s) (LRB): EXPLORATORY LAPAROTOMY (N/A) HYSTERECTOMY ABDOMINAL WITH SALPINGO-OOPHORECTOMY (Bilateral) LYMPHADENECTOMY (N/A) LAPAROSCOPIC PARTIAL GASTRECTOMY (N/A)  Subjective: Patient reports comfortable this morning except for gas pains. Foley replaced last night but now out again. No flatus.   Objective: Vital signs in last 24 hours: Temp:  [98.4 F (36.9 C)-98.9 F (37.2 C)] 98.9 F (37.2 C) (01/31 0612) Pulse Rate:  [66-112] 112 (01/31 0612) Resp:  [16-17] 17 (01/31 0612) BP: (128-141)/(60-71) 141/71 (01/31 0612) SpO2:  [97 %-99 %] 97 % (01/31 0612) Last BM Date: 02/05/17  Intake/Output from previous day: 01/30 0701 - 01/31 0700 In: 2340 [P.O.:540; I.V.:1650; IV Piggyback:150] Out: 1200 [Urine:1200]  Physical Examination: General: alert, cooperative and no distress Resp: clear to auscultation bilaterally Cardio: regular rate and rhythm, S1, S2 normal, no murmur, click, rub or gallop GI: incision: midline op site dressing intact with no evidence of drainage and abdomen soft, active bowel sounds, tympanic, slightly distended with gas Extremities: extremities normal, atraumatic, no cyanosis or edema  Labs: WBC/Hgb/Hct/Plts:  18.6/7.9/24.0/490 (01/30 1335) BUN/Cr/glu/ALT/AST/amyl/lip:  32/1.62/--/--/--/--/-- (01/30 1335)  Assessment: 84 y.o. s/p Procedure(s): EXPLORATORY LAPAROTOMY HYSTERECTOMY ABDOMINAL WITH SALPINGO-OOPHORECTOMY LYMPHADENECTOMY LAPAROSCOPIC PARTIAL GASTRECTOMY: stable Pain:  Pain is well-controlled on PRN medications.  Heme: Hgb Stable post-op.  CV: BP and HR stable post-op.  Continue to monitor with ordered vital signs.  GI:  Tolerating po: Yes.  GU: Foley to be removed this am.  Will reassess voiding success today.  FEN: Stable post-operatively. K+ stable in the afternoon yesterday.   Endo: Diabetes mellitus Type II, under poor control..  CBG:  CBG (last 3)  Recent Labs    02/07/17 1658 02/07/17 2148  02/08/17 0816  GLUCAP 135* 105* 115*    Prophylaxis: pharmacologic prophylaxis (with any of the following: enoxaparin (Lovenox) 40mg  SQ 2 hours prior to surgery then every day) and intermittent pneumatic compression boots.  Plan: cont insulin sliding scale to resistant Diet to Carb mod Encourage IS use, deep breathing, and coughing Suppository for gas pains.  Encourage ambulation.   Anticipate discharge tomorrow 02/08/17   LOS: 2 days    Martha Mcdaniel 02/08/2017, 9:39 AM

## 2017-02-08 NOTE — Progress Notes (Addendum)
Patient evaluated by me after acute event of abdominal pain and distension and tachycardia.  BP 134/66 (BP Location: Right Arm)   Pulse (!) 130   Temp 97.9 F (36.6 C) (Oral)   Resp 16   Ht 5\' 4"  (1.626 m)   Wt 152 lb (68.9 kg)   SpO2 97%   BMI 26.09 kg/m   Abdomen distended, tympanitic. Rectal exam reveals stool in rectum. No palpable extracolonic masses.   A&P/ Postop obstipation, distension. Will check stat labs including cardiac troponin. EKG shows sinus tach Try fleets enema to evacuate colon Check abdominal film to rule out free air/obstruction.  Abd xray: no free air, appears consistent with ileus.

## 2017-02-08 NOTE — Progress Notes (Signed)
Called to the floor by RN because patient "not looking right".  Patient on bedside commode moaning intermittently.  Alert and oriented.  Stating she feels "sick".  Able to answer questions.  Lungs clear, diminished in LLL, no crackles or rales noted.  Abdomen distended, tympanic, tender on palpation. While sitting on bedside commode, golf ball size mass-like area palpated in the upper abdomen above the midline incision.  Area does seem to decrease when patient laying in bed.  While on bedside commode, patient became dizzy but was able to get back in bed with three person assist.  Reporting moderate pain all over on her abdomen.  Nausea and minimal amount of dark brown emesis noted.  EKG performed with sinus tach noted at 128.  Plan for portable abdomen 2V stat. Stat labs ordered per Dr. Denman George.  Patient stable, resting in bed, waiting for abdomen xray and lab when exiting the room.  RN and tech at the bedside.  Dr. Denman George aware.  Awaiting results.

## 2017-02-09 ENCOUNTER — Inpatient Hospital Stay (HOSPITAL_COMMUNITY): Payer: Medicare Other

## 2017-02-09 ENCOUNTER — Other Ambulatory Visit (HOSPITAL_COMMUNITY): Payer: Self-pay

## 2017-02-09 ENCOUNTER — Encounter (HOSPITAL_COMMUNITY): Payer: Self-pay | Admitting: Radiology

## 2017-02-09 LAB — COMPREHENSIVE METABOLIC PANEL
ALBUMIN: 2.4 g/dL — AB (ref 3.5–5.0)
ALT: 10 U/L — ABNORMAL LOW (ref 14–54)
ANION GAP: 8 (ref 5–15)
AST: 17 U/L (ref 15–41)
Alkaline Phosphatase: 98 U/L (ref 38–126)
BUN: 23 mg/dL — ABNORMAL HIGH (ref 6–20)
CO2: 24 mmol/L (ref 22–32)
Calcium: 8.9 mg/dL (ref 8.9–10.3)
Chloride: 107 mmol/L (ref 101–111)
Creatinine, Ser: 1.53 mg/dL — ABNORMAL HIGH (ref 0.44–1.00)
GFR calc non Af Amer: 31 mL/min — ABNORMAL LOW (ref >60)
GFR, EST AFRICAN AMERICAN: 36 mL/min — AB (ref >60)
GLUCOSE: 90 mg/dL (ref 65–99)
POTASSIUM: 3.9 mmol/L (ref 3.5–5.1)
SODIUM: 139 mmol/L (ref 135–145)
TOTAL PROTEIN: 6.8 g/dL (ref 6.5–8.1)
Total Bilirubin: 0.1 mg/dL — ABNORMAL LOW (ref 0.3–1.2)

## 2017-02-09 LAB — TROPONIN I: Troponin I: <0.03 ng/mL (ref <0.03)

## 2017-02-09 LAB — CBC
HCT: 24.4 % — ABNORMAL LOW (ref 36.0–46.0)
HEMOGLOBIN: 7.9 g/dL — AB (ref 12.0–15.0)
MCH: 27.5 pg (ref 26.0–34.0)
MCHC: 32.4 g/dL (ref 30.0–36.0)
MCV: 85 fL (ref 78.0–100.0)
Platelets: 630 10*3/uL — ABNORMAL HIGH (ref 150–400)
RBC: 2.87 MIL/uL — ABNORMAL LOW (ref 3.87–5.11)
RDW: 15.6 % — ABNORMAL HIGH (ref 11.5–15.5)
WBC: 16.6 10*3/uL — ABNORMAL HIGH (ref 4.0–10.5)

## 2017-02-09 LAB — URINALYSIS, ROUTINE W REFLEX MICROSCOPIC
BACTERIA UA: NONE SEEN
BILIRUBIN URINE: NEGATIVE
Glucose, UA: NEGATIVE mg/dL
KETONES UR: NEGATIVE mg/dL
Leukocytes, UA: NEGATIVE
NITRITE: NEGATIVE
PH: 5 (ref 5.0–8.0)
Protein, ur: NEGATIVE mg/dL
SPECIFIC GRAVITY, URINE: 1.011 (ref 1.005–1.030)

## 2017-02-09 LAB — BASIC METABOLIC PANEL
ANION GAP: 7 (ref 5–15)
BUN: 27 mg/dL — ABNORMAL HIGH (ref 6–20)
CALCIUM: 8.9 mg/dL (ref 8.9–10.3)
CO2: 25 mmol/L (ref 22–32)
Chloride: 106 mmol/L (ref 101–111)
Creatinine, Ser: 1.57 mg/dL — ABNORMAL HIGH (ref 0.44–1.00)
GFR calc Af Amer: 35 mL/min — ABNORMAL LOW (ref >60)
GFR calc non Af Amer: 30 mL/min — ABNORMAL LOW (ref >60)
GLUCOSE: 149 mg/dL — AB (ref 65–99)
Potassium: 4.6 mmol/L (ref 3.5–5.1)
Sodium: 138 mmol/L (ref 135–145)

## 2017-02-09 LAB — GLUCOSE, CAPILLARY
GLUCOSE-CAPILLARY: 70 mg/dL (ref 65–99)
Glucose-Capillary: 279 mg/dL — ABNORMAL HIGH (ref 65–99)
Glucose-Capillary: 92 mg/dL (ref 65–99)
Glucose-Capillary: 88 mg/dL (ref 65–99)
Glucose-Capillary: 74 mg/dL (ref 65–99)

## 2017-02-09 MED ORDER — SODIUM CHLORIDE 0.9 % IV BOLUS (SEPSIS)
250.0000 mL | Freq: Once | INTRAVENOUS | Status: AC
  Administered 2017-02-09: 12:00:00 250 mL via INTRAVENOUS

## 2017-02-09 MED ORDER — LISINOPRIL 20 MG PO TABS
20.0000 mg | ORAL_TABLET | Freq: Every day | ORAL | Status: DC
Start: 2017-02-09 — End: 2017-02-09
  Filled 2017-02-09: qty 1

## 2017-02-09 MED ORDER — INSULIN ASPART 100 UNIT/ML ~~LOC~~ SOLN
0.0000 [IU] | SUBCUTANEOUS | Status: DC
  Administered 2017-02-10 – 2017-02-13 (×4): 3 [IU] via SUBCUTANEOUS
  Administered 2017-02-13: 4 [IU] via SUBCUTANEOUS
  Administered 2017-02-14: 7 [IU] via SUBCUTANEOUS

## 2017-02-09 MED ORDER — IOPAMIDOL (ISOVUE-300) INJECTION 61%: 15.0000 mL | Freq: Once | INTRAVENOUS | Status: DC | PRN

## 2017-02-09 MED ORDER — PANTOPRAZOLE SODIUM 40 MG IV SOLR
40.0000 mg | INTRAVENOUS | Status: DC
  Administered 2017-02-09 – 2017-02-16 (×8): 40 mg via INTRAVENOUS
  Filled 2017-02-09 (×8): qty 40

## 2017-02-09 MED ORDER — HYDROMORPHONE HCL 1 MG/ML IJ SOLN
0.5000 mg | INTRAMUSCULAR | Status: DC | PRN
  Administered 2017-02-09 – 2017-02-14 (×11): 1 mg via INTRAVENOUS
  Administered 2017-02-15 (×2): 0.5 mg via INTRAVENOUS
  Administered 2017-02-16: 1 mg via INTRAVENOUS
  Filled 2017-02-09 (×14): qty 1

## 2017-02-09 MED ORDER — IOPAMIDOL (ISOVUE-300) INJECTION 61%
INTRAVENOUS | Status: AC
  Administered 2017-02-09: 15 mL
  Filled 2017-02-09: qty 30

## 2017-02-09 MED ORDER — IOPAMIDOL (ISOVUE-370) INJECTION 76%
INTRAVENOUS | Status: AC
  Filled 2017-02-09: qty 100

## 2017-02-09 NOTE — Progress Notes (Signed)
Patient resting in bed, alert, oriented, in no acute distress.  NP tube in place and clamped after given PO contrast for CT scan.  Patient stating her abdomen feels less painful and tight.  No flatus or BM this afternoon.  Still reporting nausea after having contrast instilled via NG tube.  Foley draining clear, yellow urine.  No concerns voiced.  Discussed plan for CT scan with patient.  No needs voiced.  Will continue to follow.

## 2017-02-09 NOTE — Progress Notes (Signed)
PT Cancellation Note  Patient Details Name: Martha Mcdaniel MRN: [id number] DOB: September 04, 1933   Cancelled Treatment:    Reason Eval/Treat Not Completed: Medical issues which prohibited therapy, for r/O PE. Will check back when cleared. RN aware.    Claretha Cooper 02/09/2017, 3:02 PM Tresa Endo PT [phone]

## 2017-02-09 NOTE — Progress Notes (Signed)
3 Days Post-Op Procedure(s) (LRB): EXPLORATORY LAPAROTOMY (N/A) HYSTERECTOMY ABDOMINAL WITH SALPINGO-OOPHORECTOMY (Bilateral) LYMPHADENECTOMY (N/A) LAPAROSCOPIC PARTIAL GASTRECTOMY (N/A)  Subjective: Patient reports moderate indigestion.  States she is not feeling well and states yesterday was a better day.  Continues to report moderate abdominal discomfort.  Minimal amount of flatus passed.  Small, hard BM per nursing yesterday after fleets.  She denies dizziness at this time but states she has not been out of bed.  Decreased PO intake this am due to indigestion, nausea, with intermittent vomiting.  Denies chest pain, dyspnea.      Objective: Vital signs in last 24 hours: Temp:  [97.9 F (36.6 C)-99.6 F (37.6 C)] 98.7 F (37.1 C) (02/01 0513) Pulse Rate:  [128-140] 128 (02/01 0513) Resp:  [16-18] 16 (02/01 0513) BP: (134-171)/(66-85) 166/85 (02/01 0513) SpO2:  [97 %] 97 % (01/31 1420) Last BM Date: 02/08/17(small amt after enema)  Intake/Output from previous day: 01/31 0701 - 02/01 0700 In: 2742.5 [P.O.:400; I.V.:1742.5] Out: 600 [Urine:600]  Physical Examination: General: alert, cooperative, appears stated age, no distress and appears uncomfortable at times Resp: slightly diminished in the bases, no crackles or rales Cardio: tachycardic at 128 bpm, no murmurs, clicks, or rubs GI: abdomen moderately distended, tympanic on percussion in all quadrants, faint bowel sounds with intermittent high pitched sounds, mild tenderness, midline incision with op site dressing in place along with lap sites with dermabond without erythema or drainage Extremities: bilateral non-pitting mild pedal edema, no cyanosis Foley in place with clear, yellow urine in bag  Labs: WBC/Hgb/Hct/Plts:  16.6/7.9/24.4/630 (02/01 0415) BUN/Cr/glu/ALT/AST/amyl/lip:  27/1.57/--/--/--/--/-- (02/01 0415)  Assessment: 84 y.o. s/p Procedure(s): EXPLORATORY LAPAROTOMY HYSTERECTOMY ABDOMINAL WITH  SALPINGO-OOPHORECTOMY LYMPHADENECTOMY LAPAROSCOPIC PARTIAL GASTRECTOMY: stable Pain:  Pain is somewhat controlled on PRN medications.  Uncomfortable due to distension. IV pain medication ordered.  Heme: Hgb 7.9 and Hct 24.4 this am.  CV: Tachycardic at 128 bpm and BP increasing.  Lisinopril reordered per Dr. Delsa Sale.  GI:  Tolerating po: No: NPO currently.  Decreased PO intake due to indigestion, N/V. Antiemetics ordered as needed.   GU: Adequate output recorded.  Plan to maintain foley due to two failed voiding trials with urinary retention.    FEN: Potassium improved at 4.6 this am.  Stable post-op.  Endo: Diabetes mellitus Type II, under good control..  CBG:  CBG (last 3)  Recent Labs    02/08/17 1701 02/08/17 2131 02/09/17 0846  GLUCAP 188* 184* 92   Prophylaxis: SCDS, lovenox 30 mg daily ordered.  Plan: CT chest to rule out PE due to tachycardia post per Dr. Othelia Pulling CT abdomen/pelvis due to post-op nausea/vomiting/abd pain/distension per Dr. Othelia Pulling IVF to NS at 100 250 cc saline bolus per CT prior to contrast administration  UA from foley NPO NG tube placement per Dr. J-Moore-discussed with Dr. Barry Dienes Bmet at 14:00 Am labs  IV dilaudid ordered Lisinopril reordered PT ordered Encourage ambulation, IS use, deep breathing and coughing Continue plan of care per Dr. Delsa Sale   LOS: 3 days    Dorothyann Gibbs 02/09/2017, 10:18 AM

## 2017-02-09 NOTE — Progress Notes (Signed)
3 Days Post-Op   Subjective/Chief Complaint: Nausea/vomiting.  NGT inserted for ileus and abd distention.     Objective: Vital signs in last 24 hours: Temp:  [98.6 F (37 C)-99.6 F (37.6 C)] 98.6 F (37 C) (02/01 1448) Pulse Rate:  [124-134] 124 (02/01 1448) Resp:  [16-17] 16 (02/01 1448) BP: (153-171)/(65-85) 153/65 (02/01 1448) SpO2:  [100 %] 100 % (02/01 1448) Last BM Date: 02/08/17(small amt after enema)  Intake/Output from previous day: 01/31 0701 - 02/01 0700 In: 2742.5 [P.O.:400; I.V.:1742.5] Out: 600 [Urine:600] Intake/Output this shift: Total I/O In: 240 [P.O.:240] Out: 1400 [Urine:500; Emesis/NG output:900]  General appearance: alert.  NGT in place.   Resp: breathing comfortably GI: soft, distended.  dressings c/d/i.   Extremities: extremities normal, atraumatic, no cyanosis or edema  Lab Results:  Recent Labs    02/08/17 1601 02/09/17 0415  WBC 17.4* 16.6*  HGB 8.7* 7.9*  HCT 27.0* 24.4*  PLT 648* 630*   BMET Recent Labs    02/08/17 1601 02/09/17 0415  NA 137 138  K 4.6 4.6  CL 104 106  CO2 23 25  GLUCOSE 199* 149*  BUN 31* 27*  CREATININE 1.81* 1.57*  CALCIUM 9.0 8.9   PT/INR No results for input(s): LABPROT, INR in the last 72 hours. ABG No results for input(s): PHART, HCO3 in the last 72 hours.  Invalid input(s): PCO2, PO2  Studies/Results: Dg Abd Portable 1v  Result Date: 02/09/2017 CLINICAL DATA:  Nasogastric tube placement EXAM: PORTABLE ABDOMEN - 1 VIEW COMPARISON:  Portable exam 1448 hours compared to 02/08/2017 FINDINGS: Tip of nasogastric tube projects over proximal to mid stomach. Dilated air-filled loops of small bowel in the mid and upper abdomen. Some gas is present within the ascending and transverse colon. No definite bowel wall thickening. Bones demineralized. IMPRESSION: Nasogastric tube tip projects over proximal to mid stomach. Persistent gaseous distention of small bowel loops with some gas in the proximal half of the  colon. Electronically Signed   By: Lavonia Dana M.D.   On: 02/09/2017 15:14   Dg Abd Portable 2v  Result Date: 02/08/2017 CLINICAL DATA:  Postop hysterectomy. Palpable mass above the midline incision. Nausea, vomiting. EXAM: PORTABLE ABDOMEN - 2 VIEW COMPARISON:  CT 12/27/2016 FINDINGS: Gaseous distention of bowel noted, likely ileus. No free air organomegaly. IMPRESSION: Gaseous distention of large and small bowel, likely ileus. Electronically Signed   By: Rolm Baptise M.D.   On: 02/08/2017 17:08    Anti-infectives: Anti-infectives (From admission, onward)   Start     Dose/Rate Route Frequency Ordered Stop   02/06/17 2100  piperacillin-tazobactam (ZOSYN) IVPB 3.375 g  Status:  Discontinued     3.375 g 12.5 mL/hr over 240 Minutes Intravenous Every 8 hours 02/06/17 2011 02/08/17 0848   02/06/17 0627  ceFAZolin (ANCEF) 2-4 GM/100ML-% IVPB    Comments:  Dione Booze   : cabinet override      02/06/17 0627 02/06/17 0827   02/06/17 0555  ceFAZolin (ANCEF) IVPB 2g/100 mL premix     2 g 200 mL/hr over 30 Minutes Intravenous On call to O.R. 02/06/17 4403 02/06/17 0842      Assessment/Plan: s/p Procedure(s): EXPLORATORY LAPAROTOMY (N/A) HYSTERECTOMY ABDOMINAL WITH SALPINGO-OOPHORECTOMY (Bilateral) LYMPHADENECTOMY (N/A) LAPAROSCOPIC PARTIAL GASTRECTOMY (N/A) NPO/NGT for ileus Await pathology Ambulate/OOB. Hyperkalemia resolved.    Levimir added  Cr improved. ABL anemia.     LOS: 3 days    Stark Klein 02/09/2017

## 2017-02-09 NOTE — Plan of Care (Signed)
NG Tube placed.  Deferred OOB to the morning.  NPO status maintained.

## 2017-02-09 NOTE — Progress Notes (Signed)
Called Dr. Thornton Papas to clarify whether the ng tip being in the "proximal to mid stomach" per the abdominal xray report.  He told us to push it in another 2 cms as it was resting against the bowel wall.   This was done and then 1 bottle of contrast was given.  CT was called and they asked [place] to place her on her right side to help facilitate the passage of the contrast.  Joylene John, NP, stopped by to check and said we could hold with just the one bottle of contrast.

## 2017-02-09 NOTE — Care Management Important Message (Signed)
Important Message  Patient Details  Name: Martha Mcdaniel MRN: [id number] Date of Birth: [birth year]   Medicare Important Message Given:  Yes    Kerin Salen 02/09/2017, 10:59 AMImportant Message  Patient Details  Name: Martha Mcdaniel MRN: [id number] Date of Birth: 1933/07/15   Medicare Important Message Given:  Yes    Kerin Salen 02/09/2017, 10:59 AM

## 2017-02-10 ENCOUNTER — Inpatient Hospital Stay (HOSPITAL_COMMUNITY): Payer: Medicare Other

## 2017-02-10 LAB — TYPE AND SCREEN
ABO/RH(D): B POS
ANTIBODY SCREEN: NEGATIVE
Unit division: 0
Unit division: 0

## 2017-02-10 LAB — URINALYSIS, ROUTINE W REFLEX MICROSCOPIC
Bacteria, UA: NONE SEEN
Bilirubin Urine: NEGATIVE
GLUCOSE, UA: NEGATIVE mg/dL
Ketones, ur: 5 mg/dL — AB
Leukocytes, UA: NEGATIVE
NITRITE: NEGATIVE
Protein, ur: NEGATIVE mg/dL
SPECIFIC GRAVITY, URINE: 1.033 — AB (ref 1.005–1.030)
pH: 5 (ref 5.0–8.0)

## 2017-02-10 LAB — BASIC METABOLIC PANEL
ANION GAP: 11 (ref 5–15)
BUN: 18 mg/dL (ref 6–20)
CALCIUM: 8.8 mg/dL — AB (ref 8.9–10.3)
CO2: 19 mmol/L — ABNORMAL LOW (ref 22–32)
Chloride: 108 mmol/L (ref 101–111)
Creatinine, Ser: 1.4 mg/dL — ABNORMAL HIGH (ref 0.44–1.00)
GFR, EST AFRICAN AMERICAN: 40 mL/min — AB (ref >60)
GFR, EST NON AFRICAN AMERICAN: 34 mL/min — AB (ref >60)
GLUCOSE: 120 mg/dL — AB (ref 65–99)
Potassium: 4.1 mmol/L (ref 3.5–5.1)
Sodium: 138 mmol/L (ref 135–145)

## 2017-02-10 LAB — GLUCOSE, CAPILLARY
GLUCOSE-CAPILLARY: 110 mg/dL — AB (ref 65–99)
GLUCOSE-CAPILLARY: 137 mg/dL — AB (ref 65–99)
Glucose-Capillary: 113 mg/dL — ABNORMAL HIGH (ref 65–99)
Glucose-Capillary: 115 mg/dL — ABNORMAL HIGH (ref 65–99)
Glucose-Capillary: 83 mg/dL (ref 65–99)
Glucose-Capillary: 85 mg/dL (ref 65–99)
Glucose-Capillary: 97 mg/dL (ref 65–99)

## 2017-02-10 LAB — BPAM RBC
BLOOD PRODUCT EXPIRATION DATE: 201902182359
BLOOD PRODUCT EXPIRATION DATE: 201902182359
ISSUE DATE / TIME: 201901291613
UNIT TYPE AND RH: 7300
UNIT TYPE AND RH: 7300

## 2017-02-10 LAB — CBC
HCT: 24.4 % — ABNORMAL LOW (ref 36.0–46.0)
Hemoglobin: 7.7 g/dL — ABNORMAL LOW (ref 12.0–15.0)
MCH: 27.6 pg (ref 26.0–34.0)
MCHC: 31.6 g/dL (ref 30.0–36.0)
MCV: 87.5 fL (ref 78.0–100.0)
PLATELETS: 653 10*3/uL — AB (ref 150–400)
RBC: 2.79 MIL/uL — ABNORMAL LOW (ref 3.87–5.11)
RDW: 15.5 % (ref 11.5–15.5)
WBC: 16.5 10*3/uL — AB (ref 4.0–10.5)

## 2017-02-10 LAB — PREPARE RBC (CROSSMATCH)

## 2017-02-10 MED ORDER — IOPAMIDOL (ISOVUE-370) INJECTION 76%
INTRAVENOUS | Status: AC
  Administered 2017-02-10: 14:00:00 100 mL
  Filled 2017-02-10: qty 100

## 2017-02-10 MED ORDER — SODIUM CHLORIDE 0.9% FLUSH: 10.0000 mL | INTRAVENOUS | Status: DC | PRN

## 2017-02-10 MED ORDER — ENOXAPARIN SODIUM 30 MG/0.3ML ~~LOC~~ SOLN
30.0000 mg | SUBCUTANEOUS | Status: DC
  Administered 2017-02-11 – 2017-02-16 (×6): 30 mg via SUBCUTANEOUS
  Filled 2017-02-10 (×6): qty 0.3

## 2017-02-10 MED ORDER — SODIUM CHLORIDE 0.9 % IV BOLUS (SEPSIS): 250.0000 mL | Freq: Once | INTRAVENOUS | Status: DC

## 2017-02-10 MED ORDER — LIDOCAINE-EPINEPHRINE (PF) 2 %-1:200000 IJ SOLN
INTRAMUSCULAR | Status: AC | PRN
  Administered 2017-02-10: 10 mL

## 2017-02-10 MED ORDER — PIPERACILLIN-TAZOBACTAM 3.375 G IVPB 30 MIN
3.3750 g | Freq: Three times a day (TID) | INTRAVENOUS | Status: DC
  Administered 2017-02-10 – 2017-02-14 (×11): 3.375 g via INTRAVENOUS
  Filled 2017-02-10 (×12): qty 50

## 2017-02-10 MED ORDER — SODIUM CHLORIDE 0.9 % IV SOLN: Freq: Once | INTRAVENOUS | Status: DC

## 2017-02-10 MED ORDER — LIDOCAINE-EPINEPHRINE (PF) 2 %-1:200000 IJ SOLN
INTRAMUSCULAR | Status: AC
  Filled 2017-02-10: qty 20

## 2017-02-10 NOTE — Progress Notes (Signed)
PT Cancellation Note  Patient Details Name: Martha Mcdaniel MRN: [id number] DOB: 06-Apr-1933   Cancelled Treatment:     reviewed and spoke with pt at bedside to explain PT eval and our role, at this time she politely declined," I am not feeling well today"  however did agree I could check back on her this afternoon.   Pt was very tearful. She did report that yesterday or the day before she walking in the room and sat EOB.   I continued to encourage her to mobilize and she agreed to let me try this afternoon or tomorrow.   Clide Dales 02/10/2017, 1:05 PM  Clide Dales, PT Pager: [phone] 02/10/2017

## 2017-02-10 NOTE — Progress Notes (Addendum)
4 Days Post-Op Procedure(s) (LRB): EXPLORATORY LAPAROTOMY (N/A) HYSTERECTOMY ABDOMINAL WITH SALPINGO-OOPHORECTOMY (Bilateral) LYMPHADENECTOMY (N/A) LAPAROSCOPIC PARTIAL GASTRECTOMY (N/A)  Subjective: Patient reports continued indigestion.  States she is not feeling well. Continues to report moderate abdominal discomfort. No flatus passed. No nausea.  She is "hungry". Objective: Vital signs in last 24 hours: Temp:  [98.3 F (36.8 C)-99.5 F (37.5 C)] 99.5 F (37.5 C) (02/02 0506) Pulse Rate:  [124-132] 125 (02/02 0506) Resp:  [16] 16 (02/02 0506) BP: (153-166)/(65-92) 166/70 (02/02 0506) SpO2:  [100 %] 100 % (02/02 0506) Last BM Date: 02/08/17  Intake/Output from previous day: 02/01 0701 - 02/02 0700 In: 1879.6 [P.O.:240; I.V.:1049.6; NG/GT:590] Out: 2100 [Urine:1150; Emesis/NG output:950]  Physical Examination: General: alert, cooperative, appears stated age, no distress and appears uncomfortable at times Resp: slightly diminished in the bases, no crackles or rales Cardio: tachycardic at 128 bpm, no murmurs, clicks, or rubs GI: abdomen softly distended, tympanic on percussion in all quadrants, faint bowel sounds with intermittent high pitched sounds, mild tenderness, midline incision with op site dressing in place along with lap sites with dermabond without erythema or drainage Extremities: bilateral non-pitting mild pedal edema, no cyanosis Foley in place with clear, yellow urine in bag  Labs:   BUN/Cr/glu/ALT/AST/amyl/lip:  23/1.53/--/10/17/--/-- (02/01 1559)  Assessment: 84 y.o. s/p Procedure(s): EXPLORATORY LAPAROTOMY HYSTERECTOMY ABDOMINAL WITH SALPINGO-OOPHORECTOMY LYMPHADENECTOMY LAPAROSCOPIC PARTIAL GASTRECTOMY: stable Pain:  Pain is controlled on PRN medications.   Heme:  ABL anemia stable.  CV: Mild B/P elevation on ACE inhibitor.  Tachycardic.   GI:  Postop ileus.  Tolerating po: No: NPO currently.  NGT in place/.  Decreased PO intake due to indigestion,  N/V. Antiemetics ordered as needed.   GU: Urinary retention.  Adequate output recorded.    FEN:  Likely normovolemia; electrolytes in range   Endo: Diabetes mellitus Type II; CBGs <140 on current regimen   CBG:  CBG (last 3)  Recent Labs    02/10/17 0005 02/10/17 0413 02/10/17 0758  GLUCAP 83 97 110*   Prophylaxis: SCDS, lovenox 30 mg daily ordered.  Plan:  >referral-->IR for IJ, central line placement >imaging after IV access established: CT of chest/abdomen/pelvis >hold lisinopril until fluids resumed >daily weights >Encourage ambulation, IS use, deep breathing and coughing    LOS: 4 days    Lahoma Crocker 02/10/2017, 10:42 AM

## 2017-02-10 NOTE — Progress Notes (Signed)
NGT suctioning but nothing in tube & no new drainage in canister. Pt c/o abd discomfort, NGT checked & found to be plugged. Added valve to tubing & reestablished suction. Immediate 125 cc obtained. Cyann Venti, [employer]

## 2017-02-10 NOTE — Procedures (Signed)
Pre procedural Diagnosis: Poor venous access Post Procedural Diagnosis: Same  Successful placement of right IJ approach 14 cm single lumen PICC line with tip at the superior caval-atrial junction.    EBL: None  No immediate post procedural complication.  The PICC line is ready for immediate use.  Ronny Bacon, MD Pager #: [phone]

## 2017-02-10 NOTE — Progress Notes (Signed)
MEDICATION-RELATED CONSULT NOTE   IR Procedure Consult - Anticoagulant/Antiplatelet PTA/Inpatient Med List Review by Pharmacist    Procedure: PICC placement    Completed: 2/2 at 1630  Post-Procedural bleeding risk per IR MD assessment:  Standard  Antithrombotic medications on inpatient or PTA profile prior to procedure:   Enoxaparin 30 mg SQ q24 hr    Recommended restart time per IR Post-Procedure Guidelines:  Resume POD1 AM   Other considerations:   n/a   Plan:     Resume Lovenox 30 mg SQ q24 hr tomorrow morning (2/3) at [place], PharmD, BCPS [phone] 02/10/2017, 8:06 PM

## 2017-02-10 NOTE — Plan of Care (Signed)
Up to chair a couple of times today.   Still with foley due to urinary retention.

## 2017-02-10 NOTE — Progress Notes (Signed)
4 Days Post-Op   Subjective/Chief Complaint: No n/v since NGT placed.  Abd less tight.  Did not get CT scan due to IV infiltrating.  Multiple attempts were made.  Pt requested that they hold off.     Objective: Vital signs in last 24 hours: Temp:  [98.3 F (36.8 C)-99.5 F (37.5 C)] 99.5 F (37.5 C) (02/02 0506) Pulse Rate:  [124-132] 125 (02/02 0506) Resp:  [16] 16 (02/02 0506) BP: (153-166)/(65-92) 166/70 (02/02 0506) SpO2:  [100 %] 100 % (02/02 0506) Last BM Date: 02/08/17  Intake/Output from previous day: 02/01 0701 - 02/02 0700 In: 1879.6 [P.O.:240; I.V.:1049.6; NG/GT:590] Out: 2100 [Urine:1150; Emesis/NG output:950] Intake/Output this shift: No intake/output data recorded.  General appearance: alert.  NGT in place.  Bilious output in ngt. Resp: breathing comfortably GI: softer than yesterday, less distended.   Extremities: extremities normal, atraumatic, no cyanosis or edema  Lab Results:  Recent Labs    02/08/17 1601 02/09/17 0415  WBC 17.4* 16.6*  HGB 8.7* 7.9*  HCT 27.0* 24.4*  PLT 648* 630*   BMET Recent Labs    02/09/17 0415 02/09/17 1559  NA 138 139  K 4.6 3.9  CL 106 107  CO2 25 24  GLUCOSE 149* 90  BUN 27* 23*  CREATININE 1.57* 1.53*  CALCIUM 8.9 8.9   PT/INR No results for input(s): LABPROT, INR in the last 72 hours. ABG No results for input(s): PHART, HCO3 in the last 72 hours.  Invalid input(s): PCO2, PO2  Studies/Results: Dg Abd Portable 1v  Result Date: 02/09/2017 CLINICAL DATA:  Nasogastric tube placement EXAM: PORTABLE ABDOMEN - 1 VIEW COMPARISON:  Portable exam 1448 hours compared to 02/08/2017 FINDINGS: Tip of nasogastric tube projects over proximal to mid stomach. Dilated air-filled loops of small bowel in the mid and upper abdomen. Some gas is present within the ascending and transverse colon. No definite bowel wall thickening. Bones demineralized. IMPRESSION: Nasogastric tube tip projects over proximal to mid stomach. Persistent  gaseous distention of small bowel loops with some gas in the proximal half of the colon. Electronically Signed   By: Lavonia Dana M.D.   On: 02/09/2017 15:14   Dg Abd Portable 2v  Result Date: 02/08/2017 CLINICAL DATA:  Postop hysterectomy. Palpable mass above the midline incision. Nausea, vomiting. EXAM: PORTABLE ABDOMEN - 2 VIEW COMPARISON:  CT 12/27/2016 FINDINGS: Gaseous distention of bowel noted, likely ileus. No free air organomegaly. IMPRESSION: Gaseous distention of large and small bowel, likely ileus. Electronically Signed   By: Rolm Baptise M.D.   On: 02/08/2017 17:08    Anti-infectives: Anti-infectives (From admission, onward)   Start     Dose/Rate Route Frequency Ordered Stop   02/06/17 2100  piperacillin-tazobactam (ZOSYN) IVPB 3.375 g  Status:  Discontinued     3.375 g 12.5 mL/hr over 240 Minutes Intravenous Every 8 hours 02/06/17 2011 02/08/17 0848   02/06/17 0627  ceFAZolin (ANCEF) 2-4 GM/100ML-% IVPB    Comments:  Dione Booze   : cabinet override      02/06/17 0627 02/06/17 0827   02/06/17 0555  ceFAZolin (ANCEF) IVPB 2g/100 mL premix     2 g 200 mL/hr over 30 Minutes Intravenous On call to O.R. 02/06/17 6712 02/06/17 0842      Assessment/Plan: s/p Procedure(s): EXPLORATORY LAPAROTOMY (N/A) HYSTERECTOMY ABDOMINAL WITH SALPINGO-OOPHORECTOMY (Bilateral) LYMPHADENECTOMY (N/A) LAPAROSCOPIC PARTIAL GASTRECTOMY (N/A) NPO/NGT for ileus Await pathology Ambulate/OOB. Hyperkalemia resolved.    Unclear why she is tachycardic.  Will need some fluids today. Hopefully she  will not have any evidence of intraabdominal infection on scan.   Cr improved to near baseline.   ABL anemia.     LOS: 4 days    Stark Klein 02/10/2017

## 2017-02-11 LAB — BASIC METABOLIC PANEL
Anion gap: 10 (ref 5–15)
BUN: 15 mg/dL (ref 6–20)
CHLORIDE: 110 mmol/L (ref 101–111)
CO2: 21 mmol/L — ABNORMAL LOW (ref 22–32)
Calcium: 8.7 mg/dL — ABNORMAL LOW (ref 8.9–10.3)
Creatinine, Ser: 1.37 mg/dL — ABNORMAL HIGH (ref 0.44–1.00)
GFR calc Af Amer: 41 mL/min — ABNORMAL LOW (ref >60)
GFR calc non Af Amer: 35 mL/min — ABNORMAL LOW (ref >60)
GLUCOSE: 136 mg/dL — AB (ref 65–99)
POTASSIUM: 4 mmol/L (ref 3.5–5.1)
Sodium: 141 mmol/L (ref 135–145)

## 2017-02-11 LAB — URINALYSIS, ROUTINE W REFLEX MICROSCOPIC
Bilirubin Urine: NEGATIVE
Glucose, UA: NEGATIVE mg/dL
Ketones, ur: 20 mg/dL — AB
LEUKOCYTES UA: NEGATIVE
Nitrite: NEGATIVE
Protein, ur: NEGATIVE mg/dL
SPECIFIC GRAVITY, URINE: 1.017 (ref 1.005–1.030)
pH: 5 (ref 5.0–8.0)

## 2017-02-11 LAB — GLUCOSE, CAPILLARY
GLUCOSE-CAPILLARY: 134 mg/dL — AB (ref 65–99)
GLUCOSE-CAPILLARY: 93 mg/dL (ref 65–99)
GLUCOSE-CAPILLARY: 118 mg/dL — AB (ref 65–99)
GLUCOSE-CAPILLARY: 120 mg/dL — AB (ref 65–99)
Glucose-Capillary: 146 mg/dL — ABNORMAL HIGH (ref 65–99)

## 2017-02-11 LAB — CBC
HCT: 24.3 % — ABNORMAL LOW (ref 36.0–46.0)
HEMOGLOBIN: 8.1 g/dL — AB (ref 12.0–15.0)
MCH: 28.6 pg (ref 26.0–34.0)
MCHC: 33.3 g/dL (ref 30.0–36.0)
MCV: 85.9 fL (ref 78.0–100.0)
Platelets: 584 10*3/uL — ABNORMAL HIGH (ref 150–400)
RBC: 2.83 MIL/uL — AB (ref 3.87–5.11)
RDW: 15.3 % (ref 11.5–15.5)
WBC: 14.9 10*3/uL — ABNORMAL HIGH (ref 4.0–10.5)

## 2017-02-11 MED ORDER — DEXTROSE-NACL 5-0.45 % IV SOLN
INTRAVENOUS | Status: DC
  Administered 2017-02-11: 15:00:00 via INTRAVENOUS

## 2017-02-11 MED ORDER — SODIUM CHLORIDE 0.9 % IV BOLUS (SEPSIS)
250.0000 mL | Freq: Once | INTRAVENOUS | Status: AC
  Administered 2017-02-11: 250 mL via INTRAVENOUS

## 2017-02-11 NOTE — Progress Notes (Signed)
5 Days Post-Op   Subjective/Chief Complaint: Got IJ line yesterday for poor access.  Still no flatus.     Objective: Vital signs in last 24 hours: Temp:  [98.7 F (37.1 C)-99.1 F (37.3 C)] 99 F (37.2 C) (02/03 0513) Pulse Rate:  [117-121] 118 (02/03 0513) Resp:  [16] 16 (02/03 0513) BP: (137-172)/(69-81) 168/81 (02/03 0513) SpO2:  [99 %-100 %] 100 % (02/03 0513) Weight:  [71.8 kg (158 lb 4.6 oz)-72.6 kg (160 lb 0.9 oz)] 72.6 kg (160 lb 0.9 oz) (02/03 0513) Last BM Date: 02/08/17  Intake/Output from previous day: 02/02 0701 - 02/03 0700 In: 2262.5 [I.V.:1870; Blood:342.5; IV Piggyback:50] Out: 1937 [TKWIO:9735; Emesis/NG output:1000] Intake/Output this shift: No intake/output data recorded.  General appearance: alert.  NGT in place.  Bilious output in ngt. Resp: breathing comfortably GI: continues to improve.  Softer, less distended.   Extremities: extremities normal, atraumatic, no cyanosis or edema  Lab Results:  Recent Labs    02/10/17 1106 02/11/17 0401  WBC 16.5* 14.9*  HGB 7.7* 8.1*  HCT 24.4* 24.3*  PLT 653* 584*   BMET Recent Labs    02/10/17 1106 02/11/17 0401  NA 138 141  K 4.1 4.0  CL 108 110  CO2 19* 21*  GLUCOSE 120* 136*  BUN 18 15  CREATININE 1.40* 1.37*  CALCIUM 8.8* 8.7*   PT/INR No results for input(s): LABPROT, INR in the last 72 hours. ABG No results for input(s): PHART, HCO3 in the last 72 hours.  Invalid input(s): PCO2, PO2  Studies/Results: Ct Angio Chest Pe W Or Wo Contrast  Result Date: 02/10/2017 CLINICAL DATA:  Nausea/vomiting. NGT inserted for ileus and abd distention. tachycardic. 4 days post op EXAM: CT ANGIOGRAPHY CHEST CT ABDOMEN AND PELVIS WITH CONTRAST TECHNIQUE: Multidetector CT imaging of the chest was performed using the standard protocol during bolus administration of intravenous contrast. Multiplanar CT image reconstructions and MIPs were obtained to evaluate the vascular anatomy. Multidetector CT imaging of the  abdomen and pelvis was performed using the standard protocol during bolus administration of intravenous contrast. CONTRAST:  142m ISOVUE-370 IOPAMIDOL (ISOVUE-370) INJECTION 76% COMPARISON:  12/27/2016 FINDINGS: CTA CHEST FINDINGS Cardiovascular: Satisfactory opacification of pulmonary arteries noted, and there is no evidence of pulmonary emboli. Adequate contrast opacification of the thoracic aorta with no evidence of dissection, aneurysm, or stenosis. There is classic 3-vessel brachiocephalic arch anatomy without proximal stenosis. Scattered calcified plaque in the distal arch and proximal abdominal aorta. Mediastinum/Nodes: No enlarged mediastinal, hilar, or axillary lymph nodes. Thyroid gland, trachea, and esophagus demonstrate no significant findings. Lungs/Pleura: Small pleural effusions left greater than right without evident loculation or pleural enhancement. Dependent atelectasis/consolidation in the posterior left lower lobe. 0.8 cm subpleural nodule in the left upper lobe image 30/6. No pneumothorax.  No pleural nodularity. Musculoskeletal: Anterior vertebral endplate spurring at multiple levels in the mid and lower thoracic spine. No fracture or worrisome bone lesion. Early degenerative spurring in bilateral shoulders. Review of the MIP images confirms the above findings. CT ABDOMEN and PELVIS FINDINGS Hepatobiliary: No focal liver abnormality is seen. No gallstones, gallbladder wall thickening, or biliary dilatation. Pancreas: Unremarkable. No pancreatic ductal dilatation or surrounding inflammatory changes. Spleen: Normal in size without focal abnormality. Adrenals/Urinary Tract: Normal adrenal glands. Unremarkable left kidney. 4.1 cm exophytic cyst from the upper pole right kidney. No hydronephrosis. Urinary bladder decompressed by Foley catheter. Stomach/Bowel: Nasogastric tube into the decompressed stomach. Anastomotic staple line along the greater curvature of the stomach. Interval resection of  the partially  calcified gastric mass. There is dilatation of multiple mid and distal small bowel loops. The distal most loops of ileum including terminal ileum are decompressed and unremarkable. There is no discrete transition zone or evident etiology for the small bowel dilatation. There is mild distention of the proximal colon, decompressed distally, unremarkable. Vascular/Lymphatic: Scattered atheromatous calcifications in the aorta and iliac arteries without aneurysm. No venous anomaly. No abdominal or pelvic adenopathy localized. Reproductive: Interval hysterectomy.  No adnexal masses. Other: Scattered pelvic and upper abdominal ascites without evident loculation or peripheral enhancement. Tiny scattered peritoneal gas bubbles in the right upper abdomen and lower pelvis, presumably related to recent abdominal surgical procedures. Musculoskeletal: Degenerative disc disease L5-S1. Anterior vertebral endplate spurring at multiple levels in the lower thoracic spine. No fracture or worrisome bone lesion. Review of the MIP images confirms the above findings. IMPRESSION: 1. Postop changes of interval hysterectomy with scattered pelvic ascites, no discrete loculated or enhancing collection to suggest abscess. 2. Dilated small bowel loops without discrete transition point or evidence of mechanical obstruction, suggesting adynamic ileus. 3. Negative for acute PE or thoracic aortic dissection. 4. Solitary 0.8 cm left upper lobe pulmonary nodule, possibly a metastatic focus given clinical history. Follow-up recommended. 5. Small bilateral pleural effusions. Electronically Signed   By: Lucrezia Europe M.D.   On: 02/10/2017 14:57   Ct Abdomen Pelvis W Contrast  Result Date: 02/10/2017 CLINICAL DATA:  Nausea/vomiting. NGT inserted for ileus and abd distention. tachycardic. 4 days post op EXAM: CT ANGIOGRAPHY CHEST CT ABDOMEN AND PELVIS WITH CONTRAST TECHNIQUE: Multidetector CT imaging of the chest was performed using the  standard protocol during bolus administration of intravenous contrast. Multiplanar CT image reconstructions and MIPs were obtained to evaluate the vascular anatomy. Multidetector CT imaging of the abdomen and pelvis was performed using the standard protocol during bolus administration of intravenous contrast. CONTRAST:  154m ISOVUE-370 IOPAMIDOL (ISOVUE-370) INJECTION 76% COMPARISON:  12/27/2016 FINDINGS: CTA CHEST FINDINGS Cardiovascular: Satisfactory opacification of pulmonary arteries noted, and there is no evidence of pulmonary emboli. Adequate contrast opacification of the thoracic aorta with no evidence of dissection, aneurysm, or stenosis. There is classic 3-vessel brachiocephalic arch anatomy without proximal stenosis. Scattered calcified plaque in the distal arch and proximal abdominal aorta. Mediastinum/Nodes: No enlarged mediastinal, hilar, or axillary lymph nodes. Thyroid gland, trachea, and esophagus demonstrate no significant findings. Lungs/Pleura: Small pleural effusions left greater than right without evident loculation or pleural enhancement. Dependent atelectasis/consolidation in the posterior left lower lobe. 0.8 cm subpleural nodule in the left upper lobe image 30/6. No pneumothorax.  No pleural nodularity. Musculoskeletal: Anterior vertebral endplate spurring at multiple levels in the mid and lower thoracic spine. No fracture or worrisome bone lesion. Early degenerative spurring in bilateral shoulders. Review of the MIP images confirms the above findings. CT ABDOMEN and PELVIS FINDINGS Hepatobiliary: No focal liver abnormality is seen. No gallstones, gallbladder wall thickening, or biliary dilatation. Pancreas: Unremarkable. No pancreatic ductal dilatation or surrounding inflammatory changes. Spleen: Normal in size without focal abnormality. Adrenals/Urinary Tract: Normal adrenal glands. Unremarkable left kidney. 4.1 cm exophytic cyst from the upper pole right kidney. No hydronephrosis.  Urinary bladder decompressed by Foley catheter. Stomach/Bowel: Nasogastric tube into the decompressed stomach. Anastomotic staple line along the greater curvature of the stomach. Interval resection of the partially calcified gastric mass. There is dilatation of multiple mid and distal small bowel loops. The distal most loops of ileum including terminal ileum are decompressed and unremarkable. There is no discrete transition zone or evident etiology for  the small bowel dilatation. There is mild distention of the proximal colon, decompressed distally, unremarkable. Vascular/Lymphatic: Scattered atheromatous calcifications in the aorta and iliac arteries without aneurysm. No venous anomaly. No abdominal or pelvic adenopathy localized. Reproductive: Interval hysterectomy.  No adnexal masses. Other: Scattered pelvic and upper abdominal ascites without evident loculation or peripheral enhancement. Tiny scattered peritoneal gas bubbles in the right upper abdomen and lower pelvis, presumably related to recent abdominal surgical procedures. Musculoskeletal: Degenerative disc disease L5-S1. Anterior vertebral endplate spurring at multiple levels in the lower thoracic spine. No fracture or worrisome bone lesion. Review of the MIP images confirms the above findings. IMPRESSION: 1. Postop changes of interval hysterectomy with scattered pelvic ascites, no discrete loculated or enhancing collection to suggest abscess. 2. Dilated small bowel loops without discrete transition point or evidence of mechanical obstruction, suggesting adynamic ileus. 3. Negative for acute PE or thoracic aortic dissection. 4. Solitary 0.8 cm left upper lobe pulmonary nodule, possibly a metastatic focus given clinical history. Follow-up recommended. 5. Small bilateral pleural effusions. Electronically Signed   By: Lucrezia Europe M.D.   On: 02/10/2017 14:57   Dg Abd Portable 1v  Result Date: 02/09/2017 CLINICAL DATA:  Nasogastric tube placement EXAM:  PORTABLE ABDOMEN - 1 VIEW COMPARISON:  Portable exam 1448 hours compared to 02/08/2017 FINDINGS: Tip of nasogastric tube projects over proximal to mid stomach. Dilated air-filled loops of small bowel in the mid and upper abdomen. Some gas is present within the ascending and transverse colon. No definite bowel wall thickening. Bones demineralized. IMPRESSION: Nasogastric tube tip projects over proximal to mid stomach. Persistent gaseous distention of small bowel loops with some gas in the proximal half of the colon. Electronically Signed   By: Lavonia Dana M.D.   On: 02/09/2017 15:14   Ir Picc Placement Right >5 Yrs Inc Img Guide  Result Date: 02/10/2017 INDICATION: Poor venous access. In need of intravenous access for medication and contrast administration and for blood draws while hospitalized. Patient with history of chronic renal insufficiency and as such, request made for placement of a non tunneled IJ approach central venous catheter. EXAM: ULTRASOUND AND FLUOROSCOPIC GUIDED PICC LINE INSERTION MEDICATIONS: None. CONTRAST:  None FLUOROSCOPY TIME:  6 seconds COMPLICATIONS: None immediate. TECHNIQUE: The procedure, risks, benefits, and alternatives were explained to the patient and informed written consent was obtained. A timeout was performed prior to the initiation of the procedure. The right neck and upper chest was prepped with chlorhexidine in a sterile fashion, and a sterile drape was applied covering the operative field. Maximum barrier sterile technique with sterile gowns and gloves were used for the procedure. A timeout was performed prior to the initiation of the procedure. Local anesthesia was provided with 1% lidocaine. Under direct ultrasound guidance, the right internal jugular vein was accessed with a micropuncture kit after the overlying soft tissues were anesthetized with 1% lidocaine. After the overlying soft tissues were anesthetized, a small venotomy incision was created and a micropuncture  kit was utilized to access the right internal jugular vein. Real-time ultrasound guidance was utilized for vascular access including the acquisition of a permanent ultrasound image documenting patency of the accessed vessel. A guidewire was advanced to the level of the superior caval-atrial junction for measurement purposes and the PICC line was cut to length. A peel-away sheath was placed and a 14 cm, 5 [place], single lumen was inserted to level of the superior caval-atrial junction. A post procedure spot fluoroscopic was obtained. The catheter easily aspirated and flushed  and was sutured in place. A dressing was placed. The patient tolerated the procedure well without immediate post procedural complication. FINDINGS: After catheter placement, the tip lies within the superior cavoatrial junction. The catheter aspirates and flushes normally and is ready for immediate use. IMPRESSION: Successful ultrasound and fluoroscopic guided placement of a right internal jugular vein approach, 14 cm, 5 [place], single lumen PICC with tip at the superior caval-atrial junction. The PICC line is ready for immediate use. Electronically Signed   By: Sandi Mariscal M.D.   On: 02/10/2017 16:29    Anti-infectives: Anti-infectives (From admission, onward)   Start     Dose/Rate Route Frequency Ordered Stop   02/10/17 2000  piperacillin-tazobactam (ZOSYN) IVPB 3.375 g     3.375 g 12.5 mL/hr over 240 Minutes Intravenous Every 8 hours 02/10/17 1826     02/06/17 2100  piperacillin-tazobactam (ZOSYN) IVPB 3.375 g  Status:  Discontinued     3.375 g 12.5 mL/hr over 240 Minutes Intravenous Every 8 hours 02/06/17 2011 02/08/17 0848   02/06/17 0627  ceFAZolin (ANCEF) 2-4 GM/100ML-% IVPB    Comments:  Dione Booze   : cabinet override      02/06/17 0627 02/06/17 0827   02/06/17 0555  ceFAZolin (ANCEF) IVPB 2g/100 mL premix     2 g 200 mL/hr over 30 Minutes Intravenous On call to O.R. 02/06/17 [id number] 02/06/17 0842       Assessment/Plan: s/p Procedure(s): EXPLORATORY LAPAROTOMY (N/A) HYSTERECTOMY ABDOMINAL WITH SALPINGO-OOPHORECTOMY (Bilateral) LYMPHADENECTOMY (N/A) LAPAROSCOPIC PARTIAL GASTRECTOMY (N/A) NPO/NGT for ileus. CT c/w ileus. Await pathology  ABL anemia- got blood.     LOS: 5 days    Stark Klein 02/11/2017

## 2017-02-11 NOTE — Evaluation (Signed)
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 however 02/06/2017 pt began to feel really sick and then admit to hospital undergoing exxploratory laparotomy with bilateral hysterectomy abdominal salpingo-oophorectomy with lympadenectomy, and laparoscopic partial gastrectomy. Currenly NPO with NGT for illeus as well.   Clinical Impression  PT tolerated mobility and ambulation well today. Pt stated she had been getting up in room with nursing tow alk to the bathroom, however the longer walk was needed for she does not feel back to baseline and feels very weak. Walk was steady with RW , but very slow. Educated log roll in bed for supine -side to sit EOB for decreased abdominal pressure. Will need to continue to educate and reinforce this.  Encouraged pt to walk with nursing staff as well. We will continue to follow to ensure progression and safety with all mobility.     Follow Up Recommendations No PT follow up    Equipment Recommendations  Rolling walker with 5" wheels    Recommendations for Other Services       Precautions / Restrictions Restrictions Weight Bearing Restrictions: No      Mobility  Bed Mobility Overal bed mobility: Needs Assistance Bed Mobility: Supine to Sit;Sit to Supine     Supine to sit: Min assist Sit to supine: Min assist   General bed mobility comments: educated and performed roll to side in hooklying and side to sit  to decrease abdominal pressure. Reversed for sit to supine. Need reinforcement on this .   Transfers Overall transfer level: Needs assistance Equipment used: Rolling walker (2 wheeled) Transfers: Sit to/from Stand Sit to Stand: Min guard            Ambulation/Gait Ambulation/Gait assistance: Min assist Ambulation Distance (Feet): 175 Feet Assistive  device: Rolling walker (2 wheeled) Gait Pattern/deviations: Step-through pattern     General Gait Details: very slow gait pattern, and pt reported she felt weak as well, but the walk felt good.   Stairs            Wheelchair Mobility    Modified Rankin (Stroke Patients Only)       Balance                                             Pertinent Vitals/Pain Pain Assessment: 0-10 Pain Score: 2  Pain Location: little sore in my belly Pain Descriptors / Indicators: Aching Pain Intervention(s): Monitored during session    Home Living Family/patient expects to be discharged to:: Private residence Living Arrangements: Children;Other relatives(from [place], however since October has been staying mostly withher daughter in [place] , and at times with her son as well. ) Available Help at Discharge: Family;Available 24 hours/day Type of Home: House Home Access: Stairs to enter Entrance Stairs-Rails: Right Entrance Stairs-Number of Steps: 3 Home Layout: Two level;Able to live on main level with bedroom/bathroom Home Equipment: None      Prior Function Level of Independence: Independent         Comments: pt states she is usually very active, loves to walk daily (1 1/2 hours per day)      Hand Dominance        Extremity/Trunk Assessment        Lower Extremity  Assessment Lower Extremity Assessment: Generalized weakness       Communication      Cognition Arousal/Alertness: Awake/alert Behavior During Therapy: WFL for tasks assessed/performed Overall Cognitive Status: Within Functional Limits for tasks assessed                                        General Comments      Exercises     Assessment/Plan    PT Assessment Patient needs continued PT services  PT Problem List Decreased strength;Decreased activity tolerance;Decreased mobility       PT Treatment Interventions Gait training;Functional mobility  training;Therapeutic activities;Patient/family education    PT Goals (Current goals can be found in the Care Plan section)  Acute Rehab PT Goals Patient Stated Goal: hope I will get back to my walking, I love to see the train during my walk  PT Goal Formulation: With patient Time For Goal Achievement: 02/25/17 Potential to Achieve Goals: Good    Frequency Min 3X/week   Barriers to discharge        Co-evaluation               AM-PAC PT "6 Clicks" Daily Activity  Outcome Measure Difficulty turning over in bed (including adjusting bedclothes, sheets and blankets)?: Unable Difficulty moving from lying on back to sitting on the side of the bed? : Unable Difficulty sitting down on and standing up from a chair with arms (e.g., wheelchair, bedside commode, etc,.)?: Unable Help needed moving to and from a bed to chair (including a wheelchair)?: A Little Help needed walking in hospital room?: A Little Help needed climbing 3-5 steps with a railing? : A Little 6 Click Score: 12    End of Session   Activity Tolerance: Patient tolerated treatment well Patient left: in bed;with bed alarm set Nurse Communication: Mobility status PT Visit Diagnosis: Muscle weakness (generalized) (M62.81)    Time: 7366-8159 PT Time Calculation (min) (ACUTE ONLY): 36 min   Charges:   PT Evaluation $PT Eval Low Complexity: 1 Low PT Treatments $Gait Training: 8-22 mins   PT G CodesClide Dales, PT Pager: [phone] 02/11/2017   Brighten Orndoff, Gatha Mayer 02/11/2017, 1:08 PM

## 2017-02-11 NOTE — Progress Notes (Signed)
5 Days Post-Op Procedure(s) (LRB): EXPLORATORY LAPAROTOMY (N/A) HYSTERECTOMY ABDOMINAL WITH SALPINGO-OOPHORECTOMY (Bilateral) LYMPHADENECTOMY (N/A) LAPAROSCOPIC PARTIAL GASTRECTOMY (N/A)  Subjective: No new complaints.  No flatus/BM Objective: Vital signs in last 24 hours: Temp:  [98.7 F (37.1 C)-99.1 F (37.3 C)] 99 F (37.2 C) (02/03 0513) Pulse Rate:  [117-121] 118 (02/03 0513) Resp:  [16] 16 (02/03 0513) BP: (137-172)/(69-81) 168/81 (02/03 0513) SpO2:  [99 %-100 %] 100 % (02/03 0513) Weight:  [158 lb 4.6 oz (71.8 kg)-160 lb 0.9 oz (72.6 kg)] 160 lb 0.9 oz (72.6 kg) (02/03 0513) Last BM Date: 02/08/17  Intake/Output from previous day: 02/02 0701 - 02/03 0700 In: 2262.5 [I.V.:1870; Blood:342.5; IV Piggyback:50] Out: 2475 [Urine:1475; Emesis/NG output:1000]  Physical Examination: General: alert, cooperative, appears stated age, no distress and appears uncomfortable at times Resp: slightly diminished in the bases, no crackles or rales Cardio: tachycardic at 118 bpm, no murmurs, clicks, or rubs GI: abdomen softly distended, tympanic on percussion in all quadrants, hypoactive bowel sounds, NT, midline incision with op site dressing in place along with lap sites with dermabond without erythema or drainage Extremities: bilateral non-pitting mild pedal edema, no cyanosis Foley in place with clear, yellow urine in bag  Labs: WBC/Hgb/Hct/Plts:  14.9/8.1/24.3/584 (02/03 0401) BUN/Cr/glu/ALT/AST/amyl/lip:  15/1.37/--/--/--/--/-- (02/03 0401) Ct Angio Chest Pe W Or Wo Contrast  Result Date: 02/10/2017 CLINICAL DATA:  Nausea/vomiting. NGT inserted for ileus and abd distention. tachycardic. 4 days post op EXAM: CT ANGIOGRAPHY CHEST CT ABDOMEN AND PELVIS WITH CONTRAST TECHNIQUE: Multidetector CT imaging of the chest was performed using the standard protocol during bolus administration of intravenous contrast. Multiplanar CT image reconstructions and MIPs were obtained to evaluate the  vascular anatomy. Multidetector CT imaging of the abdomen and pelvis was performed using the standard protocol during bolus administration of intravenous contrast. CONTRAST:  178m ISOVUE-370 IOPAMIDOL (ISOVUE-370) INJECTION 76% COMPARISON:  12/27/2016 FINDINGS: CTA CHEST FINDINGS Cardiovascular: Satisfactory opacification of pulmonary arteries noted, and there is no evidence of pulmonary emboli. Adequate contrast opacification of the thoracic aorta with no evidence of dissection, aneurysm, or stenosis. There is classic 3-vessel brachiocephalic arch anatomy without proximal stenosis. Scattered calcified plaque in the distal arch and proximal abdominal aorta. Mediastinum/Nodes: No enlarged mediastinal, hilar, or axillary lymph nodes. Thyroid gland, trachea, and esophagus demonstrate no significant findings. Lungs/Pleura: Small pleural effusions left greater than right without evident loculation or pleural enhancement. Dependent atelectasis/consolidation in the posterior left lower lobe. 0.8 cm subpleural nodule in the left upper lobe image 30/6. No pneumothorax.  No pleural nodularity. Musculoskeletal: Anterior vertebral endplate spurring at multiple levels in the mid and lower thoracic spine. No fracture or worrisome bone lesion. Early degenerative spurring in bilateral shoulders. Review of the MIP images confirms the above findings. CT ABDOMEN and PELVIS FINDINGS Hepatobiliary: No focal liver abnormality is seen. No gallstones, gallbladder wall thickening, or biliary dilatation. Pancreas: Unremarkable. No pancreatic ductal dilatation or surrounding inflammatory changes. Spleen: Normal in size without focal abnormality. Adrenals/Urinary Tract: Normal adrenal glands. Unremarkable left kidney. 4.1 cm exophytic cyst from the upper pole right kidney. No hydronephrosis. Urinary bladder decompressed by Foley catheter. Stomach/Bowel: Nasogastric tube into the decompressed stomach. Anastomotic staple line along the greater  curvature of the stomach. Interval resection of the partially calcified gastric mass. There is dilatation of multiple mid and distal small bowel loops. The distal most loops of ileum including terminal ileum are decompressed and unremarkable. There is no discrete transition zone or evident etiology for the small bowel dilatation. There is mild distention  of the proximal colon, decompressed distally, unremarkable. Vascular/Lymphatic: Scattered atheromatous calcifications in the aorta and iliac arteries without aneurysm. No venous anomaly. No abdominal or pelvic adenopathy localized. Reproductive: Interval hysterectomy.  No adnexal masses. Other: Scattered pelvic and upper abdominal ascites without evident loculation or peripheral enhancement. Tiny scattered peritoneal gas bubbles in the right upper abdomen and lower pelvis, presumably related to recent abdominal surgical procedures. Musculoskeletal: Degenerative disc disease L5-S1. Anterior vertebral endplate spurring at multiple levels in the lower thoracic spine. No fracture or worrisome bone lesion. Review of the MIP images confirms the above findings. IMPRESSION: 1. Postop changes of interval hysterectomy with scattered pelvic ascites, no discrete loculated or enhancing collection to suggest abscess. 2. Dilated small bowel loops without discrete transition point or evidence of mechanical obstruction, suggesting adynamic ileus. 3. Negative for acute PE or thoracic aortic dissection. 4. Solitary 0.8 cm left upper lobe pulmonary nodule, possibly a metastatic focus given clinical history. Follow-up recommended. 5. Small bilateral pleural effusions. Electronically Signed   By: Lucrezia Europe M.D.   On: 02/10/2017 14:57   Ct Abdomen Pelvis W Contrast  Result Date: 02/10/2017 CLINICAL DATA:  Nausea/vomiting. NGT inserted for ileus and abd distention. tachycardic. 4 days post op EXAM: CT ANGIOGRAPHY CHEST CT ABDOMEN AND PELVIS WITH CONTRAST TECHNIQUE: Multidetector CT  imaging of the chest was performed using the standard protocol during bolus administration of intravenous contrast. Multiplanar CT image reconstructions and MIPs were obtained to evaluate the vascular anatomy. Multidetector CT imaging of the abdomen and pelvis was performed using the standard protocol during bolus administration of intravenous contrast. CONTRAST:  170m ISOVUE-370 IOPAMIDOL (ISOVUE-370) INJECTION 76% COMPARISON:  12/27/2016 FINDINGS: CTA CHEST FINDINGS Cardiovascular: Satisfactory opacification of pulmonary arteries noted, and there is no evidence of pulmonary emboli. Adequate contrast opacification of the thoracic aorta with no evidence of dissection, aneurysm, or stenosis. There is classic 3-vessel brachiocephalic arch anatomy without proximal stenosis. Scattered calcified plaque in the distal arch and proximal abdominal aorta. Mediastinum/Nodes: No enlarged mediastinal, hilar, or axillary lymph nodes. Thyroid gland, trachea, and esophagus demonstrate no significant findings. Lungs/Pleura: Small pleural effusions left greater than right without evident loculation or pleural enhancement. Dependent atelectasis/consolidation in the posterior left lower lobe. 0.8 cm subpleural nodule in the left upper lobe image 30/6. No pneumothorax.  No pleural nodularity. Musculoskeletal: Anterior vertebral endplate spurring at multiple levels in the mid and lower thoracic spine. No fracture or worrisome bone lesion. Early degenerative spurring in bilateral shoulders. Review of the MIP images confirms the above findings. CT ABDOMEN and PELVIS FINDINGS Hepatobiliary: No focal liver abnormality is seen. No gallstones, gallbladder wall thickening, or biliary dilatation. Pancreas: Unremarkable. No pancreatic ductal dilatation or surrounding inflammatory changes. Spleen: Normal in size without focal abnormality. Adrenals/Urinary Tract: Normal adrenal glands. Unremarkable left kidney. 4.1 cm exophytic cyst from the upper  pole right kidney. No hydronephrosis. Urinary bladder decompressed by Foley catheter. Stomach/Bowel: Nasogastric tube into the decompressed stomach. Anastomotic staple line along the greater curvature of the stomach. Interval resection of the partially calcified gastric mass. There is dilatation of multiple mid and distal small bowel loops. The distal most loops of ileum including terminal ileum are decompressed and unremarkable. There is no discrete transition zone or evident etiology for the small bowel dilatation. There is mild distention of the proximal colon, decompressed distally, unremarkable. Vascular/Lymphatic: Scattered atheromatous calcifications in the aorta and iliac arteries without aneurysm. No venous anomaly. No abdominal or pelvic adenopathy localized. Reproductive: Interval hysterectomy.  No adnexal masses. Other: Scattered  pelvic and upper abdominal ascites without evident loculation or peripheral enhancement. Tiny scattered peritoneal gas bubbles in the right upper abdomen and lower pelvis, presumably related to recent abdominal surgical procedures. Musculoskeletal: Degenerative disc disease L5-S1. Anterior vertebral endplate spurring at multiple levels in the lower thoracic spine. No fracture or worrisome bone lesion. Review of the MIP images confirms the above findings. IMPRESSION: 1. Postop changes of interval hysterectomy with scattered pelvic ascites, no discrete loculated or enhancing collection to suggest abscess. 2. Dilated small bowel loops without discrete transition point or evidence of mechanical obstruction, suggesting adynamic ileus. 3. Negative for acute PE or thoracic aortic dissection. 4. Solitary 0.8 cm left upper lobe pulmonary nodule, possibly a metastatic focus given clinical history. Follow-up recommended. 5. Small bilateral pleural effusions. Electronically Signed   By: Lucrezia Europe M.D.   On: 02/10/2017 14:57   Ir Picc Placement Right >5 Yrs Inc Img Guide  Result Date:  02/10/2017 INDICATION: Poor venous access. In need of intravenous access for medication and contrast administration and for blood draws while hospitalized. Patient with history of chronic renal insufficiency and as such, request made for placement of a non tunneled IJ approach central venous catheter. EXAM: ULTRASOUND AND FLUOROSCOPIC GUIDED PICC LINE INSERTION MEDICATIONS: None. CONTRAST:  None FLUOROSCOPY TIME:  6 seconds COMPLICATIONS: None immediate. TECHNIQUE: The procedure, risks, benefits, and alternatives were explained to the patient and informed written consent was obtained. A timeout was performed prior to the initiation of the procedure. The right neck and upper chest was prepped with chlorhexidine in a sterile fashion, and a sterile drape was applied covering the operative field. Maximum barrier sterile technique with sterile gowns and gloves were used for the procedure. A timeout was performed prior to the initiation of the procedure. Local anesthesia was provided with 1% lidocaine. Under direct ultrasound guidance, the right internal jugular vein was accessed with a micropuncture kit after the overlying soft tissues were anesthetized with 1% lidocaine. After the overlying soft tissues were anesthetized, a small venotomy incision was created and a micropuncture kit was utilized to access the right internal jugular vein. Real-time ultrasound guidance was utilized for vascular access including the acquisition of a permanent ultrasound image documenting patency of the accessed vessel. A guidewire was advanced to the level of the superior caval-atrial junction for measurement purposes and the PICC line was cut to length. A peel-away sheath was placed and a 14 cm, 5 [place], single lumen was inserted to level of the superior caval-atrial junction. A post procedure spot fluoroscopic was obtained. The catheter easily aspirated and flushed and was sutured in place. A dressing was placed. The patient tolerated  the procedure well without immediate post procedural complication. FINDINGS: After catheter placement, the tip lies within the superior cavoatrial junction. The catheter aspirates and flushes normally and is ready for immediate use. IMPRESSION: Successful ultrasound and fluoroscopic guided placement of a right internal jugular vein approach, 14 cm, 5 [place], single lumen PICC with tip at the superior caval-atrial junction. The PICC line is ready for immediate use. Electronically Signed   By: Sandi Mariscal M.D.   On: 02/10/2017 16:29   Assessment: 84 y.o. s/p Procedure(s): EXPLORATORY LAPAROTOMY HYSTERECTOMY ABDOMINAL WITH SALPINGO-OOPHORECTOMY LYMPHADENECTOMY LAPAROSCOPIC PARTIAL GASTRECTOMY: stable Pain:  Pain is controlled on PRN medications.   Heme:  ABL anemia; s/p transfusion 1 unit PRBC  CV: Mild B/P elevation off ACE inhibitor.  Tachycardic.   GI:  Postop ileus.  Tolerating po: No: NPO currently.  NGT in place.  Antiemetics ordered as needed. PPI for stress ulcer prophylaxis  GU: Urinary retention.  Adequate output recorded.    FEN:  Likely hypovolemic/intravasular volume depletion; electrolytes in range   Endo: Diabetes mellitus Type II; CBGs <140 on current regimen  ID:  WBC trending down.  Zosyn resumed   CBG:  CBG (last 3)  Recent Labs    02/10/17 2354 02/11/17 0403 02/11/17 0756  GLUCAP 115* 134* 93   Prophylaxis: SCDS, lovenox 30 mg daily ordered.  Plan:  >continue NGT >check TFT/Mg >hold lisinopril until fluid status improves >daily weights >Encourage ambulation, IS use, deep breathing and coughing    LOS: 5 days    Lahoma Crocker 02/11/2017, 9:15 AM

## 2017-02-12 LAB — URINALYSIS, ROUTINE W REFLEX MICROSCOPIC
Bilirubin Urine: NEGATIVE
GLUCOSE, UA: NEGATIVE mg/dL
Ketones, ur: NEGATIVE mg/dL
LEUKOCYTES UA: NEGATIVE
NITRITE: NEGATIVE
PH: 5 (ref 5.0–8.0)
PROTEIN: NEGATIVE mg/dL
Specific Gravity, Urine: 1.015 (ref 1.005–1.030)

## 2017-02-12 LAB — CBC
HEMATOCRIT: 22.3 % — AB (ref 36.0–46.0)
Hemoglobin: 7.4 g/dL — ABNORMAL LOW (ref 12.0–15.0)
MCH: 28.6 pg (ref 26.0–34.0)
MCHC: 33.2 g/dL (ref 30.0–36.0)
MCV: 86.1 fL (ref 78.0–100.0)
PLATELETS: 542 10*3/uL — AB (ref 150–400)
RBC: 2.59 MIL/uL — ABNORMAL LOW (ref 3.87–5.11)
RDW: 16 % — AB (ref 11.5–15.5)
WBC: 13.4 10*3/uL — AB (ref 4.0–10.5)

## 2017-02-12 LAB — GLUCOSE, CAPILLARY
GLUCOSE-CAPILLARY: 72 mg/dL (ref 65–99)
Glucose-Capillary: 100 mg/dL — ABNORMAL HIGH (ref 65–99)
Glucose-Capillary: 106 mg/dL — ABNORMAL HIGH (ref 65–99)
Glucose-Capillary: 78 mg/dL (ref 65–99)
Glucose-Capillary: 79 mg/dL (ref 65–99)
Glucose-Capillary: 95 mg/dL (ref 65–99)
Glucose-Capillary: 96 mg/dL (ref 65–99)

## 2017-02-12 LAB — PREPARE RBC (CROSSMATCH)

## 2017-02-12 LAB — BASIC METABOLIC PANEL
Anion gap: 7 (ref 5–15)
BUN: 13 mg/dL (ref 6–20)
CALCIUM: 8.6 mg/dL — AB (ref 8.9–10.3)
CO2: 24 mmol/L (ref 22–32)
CREATININE: 1.34 mg/dL — AB (ref 0.44–1.00)
Chloride: 113 mmol/L — ABNORMAL HIGH (ref 101–111)
GFR calc Af Amer: 42 mL/min — ABNORMAL LOW (ref >60)
GFR, EST NON AFRICAN AMERICAN: 36 mL/min — AB (ref >60)
GLUCOSE: 100 mg/dL — AB (ref 65–99)
Potassium: 3.4 mmol/L — ABNORMAL LOW (ref 3.5–5.1)
Sodium: 144 mmol/L (ref 135–145)

## 2017-02-12 LAB — TSH: TSH: 0.555 u[IU]/mL (ref 0.350–4.500)

## 2017-02-12 LAB — MAGNESIUM: Magnesium: 1.6 mg/dL — ABNORMAL LOW (ref 1.7–2.4)

## 2017-02-12 MED ORDER — MAGNESIUM SULFATE 2 GM/50ML IV SOLN
2.0000 g | Freq: Once | INTRAVENOUS | Status: AC
  Administered 2017-02-12: 2 g via INTRAVENOUS
  Filled 2017-02-12: qty 50

## 2017-02-12 MED ORDER — SODIUM CHLORIDE 0.9 % IV SOLN: Freq: Once | INTRAVENOUS | Status: DC

## 2017-02-12 MED ORDER — ACETAMINOPHEN 650 MG RE SUPP
650.0000 mg | RECTAL | Status: AC | PRN
  Administered 2017-02-12: 650 mg via RECTAL
  Filled 2017-02-12: qty 1

## 2017-02-12 MED ORDER — KCL IN DEXTROSE-NACL 10-5-0.45 MEQ/L-%-% IV SOLN
INTRAVENOUS | Status: DC
  Administered 2017-02-12 – 2017-02-16 (×9): via INTRAVENOUS
  Filled 2017-02-12 (×12): qty 1000

## 2017-02-12 NOTE — Progress Notes (Signed)
6 Days Post-Op Procedure(s) (LRB): EXPLORATORY LAPAROTOMY (N/A) HYSTERECTOMY ABDOMINAL WITH SALPINGO-OOPHORECTOMY (Bilateral) LYMPHADENECTOMY (N/A) LAPAROSCOPIC PARTIAL GASTRECTOMY (N/A)  Subjective: Patient reports moderate abdominal pain.  No nausea or emesis reported while having NG clamped.  Up with assistance.  Denies chest pain, dyspnea, passing flatus, or having a bowel movement.  No concerns voiced.    Objective: Vital signs in last 24 hours: Temp:  [97.6 F (36.4 C)-98.7 F (37.1 C)] 97.6 F (36.4 C) (02/04 0520) Pulse Rate:  [100-119] 119 (02/04 0520) Resp:  [15-16] 15 (02/03 2024) BP: (144-156)/(65-82) 155/68 (02/04 0520) SpO2:  [98 %-100 %] 98 % (02/04 0520) Last BM Date: (Pt unable to recall last BM)  Intake/Output from previous day: 02/03 0701 - 02/04 0700 In: 3766.3 [P.O.:60; I.V.:3306.3; IV Piggyback:400] Out: 1300 [Urine:600; Emesis/NG output:700]  Physical Examination: General: resting quietly in bed, arousable, oriented, tearful at times while describing abdominal pain Resp: diminished in the bases, no crackles noted Cardio: tachycardic at 829HBZ, no murmurs, clicks, or rubs noted GI: incision: midline incision with dry dressing in place, lap sites with dermabond without erythema or drainage and abdomen soft, slightly distended, hypoactive bowel sounds, slightly tympanic Extremities: mild bilateral, non pitting pedal edema Foley in place with dark yellow urine in the bag  Labs: WBC/Hgb/Hct/Plts:  13.4/7.4/22.3/542 (02/04 0404) BUN/Cr/glu/ALT/AST/amyl/lip:  13/1.34/--/--/--/--/-- (02/04 0404)  Assessment: 84 y.o. s/p Procedure(s): EXPLORATORY LAPAROTOMY HYSTERECTOMY ABDOMINAL WITH SALPINGO-OOPHORECTOMY LYMPHADENECTOMY LAPAROSCOPIC PARTIAL GASTRECTOMY: stable Pain:  Pain is well-controlled on PRN medications.  RN notified to give pain medication at this time.  Heme: Hgb 7.4 and Hct 22.3 this am.  S/P blood transfusion 1 unit PRBCs 2/2-2/3. Remains  tachycardic but improving.  ID: WBC trending downward, 13.4 this am.  Zosyn ordered.   CV: BP slightly elevated and tachycardic at 120 bpm. Continue to monitor with ordered vital signs.  GI:  Tolerating po: No: NPO with NG tube in place.  Protonix IV ordered.  CT AP on 02/10/17:  IMPRESSION: 1. Postop changes of interval hysterectomy with scattered pelvic ascites, no discrete loculated or enhancing collection to suggest abscess. 2. Dilated small bowel loops without discrete transition point or evidence of mechanical obstruction, suggesting adynamic ileus. 3. Negative for acute PE or thoracic aortic dissection. 4. Solitary 0.8 cm left upper lobe pulmonary nodule, possibly a metastatic focus given clinical history. Follow-up recommended. 5. Small bilateral pleural effusions. Electronically Signed   By: Lucrezia Europe M.D.   On: 02/10/2017 14:57   GU: Adequate output reported.  Foley in place.  Creatinine 1.34.   FEN: K+ 3.4 this am and Magnesium 1.6 this am.  Plan for IV replacement.  Endo: Diabetes mellitus Type II, under good control.  CBG:  CBG (last 3)  Recent Labs    02/12/17 0516 02/12/17 0711 02/12/17 1226  GLUCAP 96 78 95   Prophylaxis: SCDs and Lovenox 30 mg daily  Plan: Change IVF to D51/2 NS with 10 meq KCL at 100 cc/hr Case management for discharge needs, possible placement Plan to transfuse one unit of PRBCs per Dr. Denman George and Dr. Othelia Pulling Trial of NG clamping this am per Dr. Delsa Sale.  Reconnect to intermittent suction per Dr. Denman George. Encourage ambulation, IS use, deep breathing, and coughing Continue plan of care per Dr. Delsa Sale   LOS: 6 days    Lossie Kalp D Sanjana Folz 02/12/2017, 12:12 PM

## 2017-02-13 LAB — TYPE AND SCREEN
ABO/RH(D): B POS
Antibody Screen: NEGATIVE
Unit division: 0
Unit division: 0

## 2017-02-13 LAB — BPAM RBC
BLOOD PRODUCT EXPIRATION DATE: 201902182359
Blood Product Expiration Date: 201902192359
ISSUE DATE / TIME: 201902022233
ISSUE DATE / TIME: 201902041817
UNIT TYPE AND RH: 7300
Unit Type and Rh: 7300

## 2017-02-13 LAB — CBC
HCT: 28.2 % — ABNORMAL LOW (ref 36.0–46.0)
Hemoglobin: 9 g/dL — ABNORMAL LOW (ref 12.0–15.0)
MCH: 27.2 pg (ref 26.0–34.0)
MCHC: 31.9 g/dL (ref 30.0–36.0)
MCV: 85.2 fL (ref 78.0–100.0)
PLATELETS: 523 10*3/uL — AB (ref 150–400)
RBC: 3.31 MIL/uL — ABNORMAL LOW (ref 3.87–5.11)
RDW: 16.1 % — AB (ref 11.5–15.5)
WBC: 11.6 10*3/uL — ABNORMAL HIGH (ref 4.0–10.5)

## 2017-02-13 LAB — GLUCOSE, CAPILLARY
GLUCOSE-CAPILLARY: 123 mg/dL — AB (ref 65–99)
GLUCOSE-CAPILLARY: 80 mg/dL (ref 65–99)
GLUCOSE-CAPILLARY: 137 mg/dL — AB (ref 65–99)
GLUCOSE-CAPILLARY: 110 mg/dL — AB (ref 65–99)
GLUCOSE-CAPILLARY: 170 mg/dL — AB (ref 65–99)
Glucose-Capillary: 69 mg/dL (ref 65–99)

## 2017-02-13 LAB — BASIC METABOLIC PANEL
Anion gap: 6 (ref 5–15)
BUN: 9 mg/dL (ref 6–20)
CHLORIDE: 111 mmol/L (ref 101–111)
CO2: 27 mmol/L (ref 22–32)
Calcium: 8.8 mg/dL — ABNORMAL LOW (ref 8.9–10.3)
Creatinine, Ser: 1.42 mg/dL — ABNORMAL HIGH (ref 0.44–1.00)
GFR calc Af Amer: 39 mL/min — ABNORMAL LOW (ref >60)
GFR calc non Af Amer: 34 mL/min — ABNORMAL LOW (ref >60)
Glucose, Bld: 78 mg/dL (ref 65–99)
Potassium: 3.5 mmol/L (ref 3.5–5.1)
SODIUM: 144 mmol/L (ref 135–145)

## 2017-02-13 MED ORDER — VANCOMYCIN HCL IN DEXTROSE 1-5 GM/200ML-% IV SOLN
1000.0000 mg | INTRAVENOUS | Status: DC
  Administered 2017-02-16: 1000 mg via INTRAVENOUS
  Filled 2017-02-13: qty 200

## 2017-02-13 MED ORDER — ACETAMINOPHEN 325 MG PO TABS
650.0000 mg | ORAL_TABLET | Freq: Four times a day (QID) | ORAL | Status: DC | PRN
  Administered 2017-02-13: 650 mg via ORAL
  Filled 2017-02-13: qty 2

## 2017-02-13 MED ORDER — SODIUM CHLORIDE 0.9% FLUSH
10.0000 mL | INTRAVENOUS | Status: DC | PRN
  Administered 2017-02-13: 20 mL
  Administered 2017-02-15: 10 mL
  Filled 2017-02-13: qty 40

## 2017-02-13 MED ORDER — VANCOMYCIN HCL 10 G IV SOLR
1500.0000 mg | Freq: Once | INTRAVENOUS | Status: AC
  Administered 2017-02-13: 1500 mg via INTRAVENOUS
  Filled 2017-02-13: qty 1500

## 2017-02-13 MED ORDER — DEXTROSE 50 % IV SOLN
25.0000 mL | Freq: Once | INTRAVENOUS | Status: AC
  Administered 2017-02-13: 05:00:00 25 mL via INTRAVENOUS
  Filled 2017-02-13: qty 50

## 2017-02-13 NOTE — Progress Notes (Signed)
02/13/17 2240 Nursing Dr Delsa Sale called reg temp 101 at this time. Also informed of patient coughing up moderate amount of clear phlegm. Order received for tylenol. Will continue to monitor patient

## 2017-02-13 NOTE — Progress Notes (Signed)
Hypoglycemic Event  CBG: 69  Treatment:  82ml D50 IV  Symptoms: None  Follow-up CBG: Time:0520 CBG Result:123  Possible Reasons for Event: NPO  Comments/MD notified:FSBG has been running low enough that pt has not needed scheduled Novolog.  Levemir given last PM per order. MD not notified as pt FSBG immediately came up to 123.    Cathie Olden

## 2017-02-13 NOTE — Progress Notes (Signed)
Spoke with son on phone, patient sleeping. Son indicates the plan for her is to d/c to home with her daughter who will be available 24/7 to assist with care. Currently ambulating a good distance with little assistance so not likely to qualify for SNF for PT. Son is agreeable to [facility] if needed, no preference for agency. Will continue to follow for [facility] needs and set up as indicated. [phone]

## 2017-02-13 NOTE — Progress Notes (Signed)
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 however 02/06/2017 pt began to feel really sick and then admit to hospital undergoing exxploratory laparotomy with bilateral hysterectomy abdominal salpingo-oophorectomy with lympadenectomy, and laparoscopic partial gastrectomy. Currenly NPO with NGT for illeus as well.     PT Comments    POD # 7 NG tube D/C now on clears Assisted OOB to amb in hallway with walker.  Required increased time and present with MOD ABD pain.  Also noted to have congestion/cough which hurts more when she coughs.    Follow Up Recommendations  No PT follow up     Equipment Recommendations  Rolling walker with 5" wheels    Recommendations for Other Services       Precautions / Restrictions Precautions Precautions: None Restrictions Weight Bearing Restrictions: No    Mobility  Bed Mobility Overal bed mobility: Needs Assistance Bed Mobility: Supine to Sit;Sit to Supine     Supine to sit: Min assist Sit to supine: Mod assist   General bed mobility comments: increased assist back to bed B LE's due to ABD pain  Transfers Overall transfer level: Needs assistance Equipment used: Rolling walker (2 wheeled) Transfers: Sit to/from Stand Sit to Stand: Min guard         General transfer comment: increased time  Ambulation/Gait Ambulation/Gait assistance: Min guard;Min assist Ambulation Distance (Feet): 145 Feet Assistive device: Rolling walker (2 wheeled) Gait Pattern/deviations: Step-through pattern Gait velocity: decreased   General Gait Details: slow gait with slight forward flex posture and MOD ABD pain.     Stairs            Wheelchair Mobility    Modified Rankin (Stroke Patients Only)       Balance                                            Cognition Arousal/Alertness: Awake/alert Behavior During Therapy: WFL for tasks assessed/performed Overall Cognitive Status: Within Functional Limits for tasks assessed                                        Exercises      General Comments        Pertinent Vitals/Pain Pain Assessment: Faces Faces Pain Scale: Hurts even more Pain Location: ABD Pain Descriptors / Indicators: Grimacing;Operative site guarding Pain Intervention(s): Monitored during session;RN gave pain meds during session;Repositioned;Heat applied    Home Living                      Prior Function            PT Goals (current goals can now be found in the care plan section) Progress towards PT goals: Progressing toward goals    Frequency    Min 3X/week      PT Plan Current plan remains appropriate    Co-evaluation              AM-PAC PT "6 Clicks" Daily Activity  Outcome Measure  Difficulty turning over in bed (including adjusting bedclothes, sheets and blankets)?: Unable Difficulty moving from lying on back to sitting on the side of  the bed? : Unable Difficulty sitting down on and standing up from a chair with arms (e.g., wheelchair, bedside commode, etc,.)?: Unable Help needed moving to and from a bed to chair (including a wheelchair)?: A Little Help needed walking in hospital room?: A Little Help needed climbing 3-5 steps with a railing? : A Little 6 Click Score: 12    End of Session Equipment Utilized During Treatment: Gait belt Activity Tolerance: Patient limited by pain;Patient limited by fatigue Patient left: in bed Nurse Communication: Mobility status;Patient requests pain meds PT Visit Diagnosis: Muscle weakness (generalized) (M62.81)     Time: 0539-7673 PT Time Calculation (min) (ACUTE ONLY): 31 min  Charges:  $Gait Training: 8-22 mins $Therapeutic Activity: 8-22 mins                    G Codes:       Rica Koyanagi  PTA [facility]  Acute   Rehab Pager      [phone]

## 2017-02-13 NOTE — Progress Notes (Signed)
7 Days Post-Op Procedure(s) (LRB): EXPLORATORY LAPAROTOMY (N/A) HYSTERECTOMY ABDOMINAL WITH SALPINGO-OOPHORECTOMY (Bilateral) LYMPHADENECTOMY (N/A) LAPAROSCOPIC PARTIAL GASTRECTOMY (N/A)  Subjective: Patient reports improved abdominal pain.   NGT outputs 600cc in past 24 hours. But now passing flatus x 1.  S/p 1 unit PRBC Ambulating in halls with assistance.   Objective: Vital signs in last 24 hours: Temp:  [98.9 F (37.2 C)-100.8 F (38.2 C)] 99.9 F (37.7 C) (02/05 0520) Pulse Rate:  [101-116] 102 (02/05 0520) Resp:  [17-20] 17 (02/05 0520) BP: (142-159)/(53-74) 148/63 (02/05 0520) SpO2:  [96 %-100 %] 100 % (02/05 0520) Weight:  [161 lb 6 oz (73.2 kg)] 161 lb 6 oz (73.2 kg) (02/04 1830) Last BM Date: (Pt unable to recall last BM)  Intake/Output from previous day: 02/04 0701 - 02/05 0700 In: 1765 [I.V.:1300; Blood:315; IV Piggyback:150] Out: 2952 [Urine:600; Emesis/NG output:665]  Physical Examination: General: resting quietly in bed, arousable, oriented, tearful at times while describing abdominal pain Resp: diminished in the bases, no crackles noted Cardio: tachycardic at 841LKG, no murmurs, clicks, or rubs noted GI: incision: midline incision with dry dressing in place, lap sites with dermabond without erythema or drainage and abdomen soft, nondistended, hypoactive bowel sounds, non tympanic Extremities: mild bilateral, non pitting pedal edema Foley in place with pale yellow urine in the bag  Labs: WBC/Hgb/Hct/Plts:  11.6/9.0/28.2/523 (02/05 4010) BUN/Cr/glu/ALT/AST/amyl/lip:  9/1.42/--/--/--/--/-- (02/05 2725)  Assessment: 84 y.o. s/p Procedure(s): EXPLORATORY LAPAROTOMY HYSTERECTOMY ABDOMINAL WITH SALPINGO-OOPHORECTOMY LYMPHADENECTOMY LAPAROSCOPIC PARTIAL GASTRECTOMY: stable Pain:  Pain is well-controlled on PRN medications.  RN notified to give pain medication at this time.  Heme: Hgb 9 this morning which is appropriate rise.   S/P blood transfusion 1 unit  PRBCs yesterday. Remains mildly tachycardic but improving.  ID: WBC trending downward, 11 this am.  Zosyn continued. Low grade fever to 100.8 on 02/12/17..   CV: BP slightly elevated and tachycardic at 120 bpm. Continue to monitor with ordered vital signs.  GI:  Slightly improved NGT output and now flatus. Will d.c. NGT and attempt clear liquids. Abdomen less distended.   GU: Adequate output reported.  Foley in place.  Creatinine 1.4 (stable). Will repeat voiding trial before discharge.  Endo: Diabetes mellitus Type II, under good control.  CBG:  CBG (last 3)  Recent Labs    02/13/17 0004 02/13/17 0424 02/13/17 0517  GLUCAP 72 69 123*   Prophylaxis: SCDs and Lovenox 30 mg daily  Plan: Cont IVF to D51/2 NS with 10 meq KCL at 100 cc/hr DC NGT  Clear liquids Encourage ambulation, IS use, deep breathing, and coughing Evaluation by PT for possible rehab placement.    LOS: 7 days    [name] 02/13/2017, 7:44 AM

## 2017-02-13 NOTE — Progress Notes (Signed)
Inpatient Diabetes Program Recommendations  AACE/ADA: New Consensus Statement on Inpatient Glycemic Control (2015)  Target Ranges:  Prepandial:   less than 140 mg/dL      Peak postprandial:   less than 180 mg/dL (1-2 hours)      Critically ill patients:  140 - 180 mg/dL   Results for Martha Mcdaniel, Martha Mcdaniel (MRN [id number]) as of 02/13/2017 07:47  Ref. Range 02/12/2017 00:06 02/12/2017 05:16 02/12/2017 07:11 02/12/2017 12:26 02/12/2017 16:21 02/12/2017 20:20  Glucose-Capillary Latest Ref Range: 65 - 99 mg/dL 79 96 78 95 100 (H) 106 (H)   Results for Martha Mcdaniel, Martha Mcdaniel (MRN [id number]) as of 02/13/2017 07:47  Ref. Range 02/13/2017 00:04 02/13/2017 04:24 02/13/2017 05:17  Glucose-Capillary Latest Ref Range: 65 - 99 mg/dL 72 69 123 (H)    Home DM Meds: Levemir 20 units QHS  Current Insulin Orders: Levemir 16 units QHS      Novolog Resistant Correction Scale/ SSI (0-20 units) Q4 hours      Patient with mild Hypoglycemic event this AM after receiving 16 units Levemir last PM.  MD- Please consider the following in-hospital insulin adjustments:  1. Reduce Levemir slightly to 12 units QHS  2. Reduce Novolog SSI to Moderate scale (0-15 units) Q4 hours     --Will follow patient during hospitalization--  Wyn Quaker RN, MSN, CDE Diabetes Coordinator Inpatient Glycemic Control Team Team Pager: [phone] (8a-5p)

## 2017-02-13 NOTE — Progress Notes (Signed)
Pharmacy Antibiotic Note  Martha Mcdaniel is a 84 y.o. 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  Pharmacy has been consulted for vancomycin dosing for sepsis. She is on day # 3 of Zosyn. WBC down to 11.6, SCr 1.42., Tmax 101.7  Plan: Zosyn 3.375g IV q8h (4 hour infusion). - continues per MD Vancomycin 1500 mg IV loading dose followed by vancomycin 1000 mg IV q48 for AUC 464 Cssmax 34.3 and Cssmin 9.8 (using Scr 1.42. Ht 162.6, IBW/ABW) Daily SCr not ordered as has daily BMET ordered by MD F/u renal fxn, wbc, temp, culture data Vancomycin levels as needed  Height: 5\' 4"  (162.6 cm) Weight: 161 lb 6 oz (73.2 kg) IBW/kg (Calculated) : 54.7  Temp (24hrs), Avg:100.1 F (37.8 C), Min:98.9 F (37.2 C), Max:101.7 F (38.7 C)  Recent Labs  Lab 02/09/17 0415 02/09/17 1559 02/10/17 1106 02/11/17 0401 02/12/17 0404 02/13/17 0437  WBC 16.6*  --  16.5* 14.9* 13.4* 11.6*  CREATININE 1.57* 1.53* 1.40* 1.37* 1.34* 1.42*    Estimated Creatinine Clearance: 30.5 mL/min (A) (by C-G formula based on SCr of 1.42 mg/dL (H)).    Allergies  Allergen Reactions  . Ibuprofen Other (See Comments)    Pt feels disoriented with medication     Antimicrobials this admission: Zosyn 1/31 x 1 dose, 2/3>>> vanc 2/5>> Dose adjustments this admission:  Microbiology results: None  Thank you for allowing pharmacy to be a part of this patient's care.  Eudelia Bunch, Pharm.D. [phone] 02/13/2017 5:24 PM

## 2017-02-14 ENCOUNTER — Inpatient Hospital Stay (HOSPITAL_COMMUNITY): Payer: Medicare Other

## 2017-02-14 LAB — GLUCOSE, CAPILLARY
GLUCOSE-CAPILLARY: 116 mg/dL — AB (ref 65–99)
Glucose-Capillary: 124 mg/dL — ABNORMAL HIGH (ref 65–99)
Glucose-Capillary: 128 mg/dL — ABNORMAL HIGH (ref 65–99)
Glucose-Capillary: 141 mg/dL — ABNORMAL HIGH (ref 65–99)
Glucose-Capillary: 170 mg/dL — ABNORMAL HIGH (ref 65–99)
Glucose-Capillary: 201 mg/dL — ABNORMAL HIGH (ref 65–99)

## 2017-02-14 LAB — BASIC METABOLIC PANEL
Anion gap: 7 (ref 5–15)
BUN: 7 mg/dL (ref 6–20)
CHLORIDE: 109 mmol/L (ref 101–111)
CO2: 23 mmol/L (ref 22–32)
Calcium: 8.2 mg/dL — ABNORMAL LOW (ref 8.9–10.3)
Creatinine, Ser: 1.51 mg/dL — ABNORMAL HIGH (ref 0.44–1.00)
GFR calc non Af Amer: 31 mL/min — ABNORMAL LOW (ref >60)
GFR, EST AFRICAN AMERICAN: 36 mL/min — AB (ref >60)
Glucose, Bld: 211 mg/dL — ABNORMAL HIGH (ref 65–99)
POTASSIUM: 3.3 mmol/L — AB (ref 3.5–5.1)
SODIUM: 139 mmol/L (ref 135–145)

## 2017-02-14 LAB — CBC
HCT: 26.6 % — ABNORMAL LOW (ref 36.0–46.0)
HEMOGLOBIN: 8.7 g/dL — AB (ref 12.0–15.0)
MCH: 27.6 pg (ref 26.0–34.0)
MCHC: 32.7 g/dL (ref 30.0–36.0)
MCV: 84.4 fL (ref 78.0–100.0)
Platelets: 446 10*3/uL — ABNORMAL HIGH (ref 150–400)
RBC: 3.15 MIL/uL — AB (ref 3.87–5.11)
RDW: 16.7 % — ABNORMAL HIGH (ref 11.5–15.5)
WBC: 12.1 10*3/uL — ABNORMAL HIGH (ref 4.0–10.5)

## 2017-02-14 MED ORDER — POTASSIUM CHLORIDE 10 MEQ/100ML IV SOLN
10.0000 meq | INTRAVENOUS | Status: AC
  Administered 2017-02-14 (×3): 10 meq via INTRAVENOUS
  Filled 2017-02-14 (×3): qty 100

## 2017-02-14 MED ORDER — ALUM & MAG HYDROXIDE-SIMETH 200-200-20 MG/5ML PO SUSP: 30.0000 mL | Freq: Four times a day (QID) | ORAL | Status: DC | PRN

## 2017-02-14 MED ORDER — INSULIN ASPART 100 UNIT/ML ~~LOC~~ SOLN
0.0000 [IU] | SUBCUTANEOUS | Status: DC
  Administered 2017-02-14: 18:00:00 2 [IU] via SUBCUTANEOUS
  Administered 2017-02-14 – 2017-02-15 (×2): 3 [IU] via SUBCUTANEOUS
  Administered 2017-02-15 (×2): 2 [IU] via SUBCUTANEOUS

## 2017-02-14 MED ORDER — INSULIN DETEMIR 100 UNIT/ML ~~LOC~~ SOLN
12.0000 [IU] | Freq: Every day | SUBCUTANEOUS | Status: DC
  Administered 2017-02-14 – 2017-02-16 (×3): 12 [IU] via SUBCUTANEOUS
  Filled 2017-02-14 (×3): qty 0.12

## 2017-02-14 MED ORDER — DEXTROMETHORPHAN-GUAIFENESIN 10-100 MG/5ML PO LIQD
5.0000 mL | Freq: Four times a day (QID) | ORAL | Status: DC | PRN
  Administered 2017-02-14: 5 mL via ORAL
  Filled 2017-02-14 (×4): qty 5

## 2017-02-14 MED ORDER — PIPERACILLIN-TAZOBACTAM 3.375 G IVPB
3.3750 g | Freq: Three times a day (TID) | INTRAVENOUS | Status: DC
  Administered 2017-02-14 – 2017-02-16 (×7): 3.375 g via INTRAVENOUS
  Filled 2017-02-14 (×8): qty 50

## 2017-02-14 NOTE — Progress Notes (Signed)
8 Days Post-Op Procedure(s) (LRB): EXPLORATORY LAPAROTOMY (N/A) HYSTERECTOMY ABDOMINAL WITH SALPINGO-OOPHORECTOMY (Bilateral) LYMPHADENECTOMY (N/A) LAPAROSCOPIC PARTIAL GASTRECTOMY (N/A)  Subjective: Patient reports improved abdominal pain.   Has some occasional emesis that feels like heartburn with clears. + bowel movement, and has flatus. + bothersome cough. Is ambulating independently  Febrile to 101.7 yesterday - blood cultures drawn and vanc started.   Objective: Vital signs in last 24 hours: Temp:  [98.9 F (37.2 C)-101 F (38.3 C)] 98.9 F (37.2 C) (02/06 1317) Pulse Rate:  [110-125] 111 (02/06 1317) Resp:  [14-16] 14 (02/06 1317) BP: (140-147)/(63-74) 140/63 (02/06 1317) SpO2:  [96 %-99 %] 99 % (02/06 1317) Weight:  [162 lb 4.1 oz (73.6 kg)] 162 lb 4.1 oz (73.6 kg) (02/05 1900) Last BM Date: 02/14/17  Intake/Output from previous day: 02/05 0701 - 02/06 0700 In: 2660 [P.O.:460; I.V.:1600; IV Piggyback:600] Out: 1300 [Urine:1300]  Physical Examination: General: resting quietly in bed, arousable, oriented,  Resp: diminished in the bases, no crackles noted Cardio: mild tachycardic, no murmurs, clicks, or rubs noted GI: incision: midline incision with dry dressing in place, lap sites with dermabond without erythema or drainage and abdomen soft, nondistended, hypoactive bowel sounds, non tympanic Extremities: mild bilateral, non pitting pedal edema Foley in place with pale yellow urine in the bag  Labs: WBC/Hgb/Hct/Plts:  12.1/8.7/26.6/446 (02/06 0345) BUN/Cr/glu/ALT/AST/amyl/lip:  7/1.51/--/--/--/--/-- (02/06 0345)  Assessment: 84 y.o. s/p Procedure(s): EXPLORATORY LAPAROTOMY HYSTERECTOMY ABDOMINAL WITH SALPINGO-OOPHORECTOMY LYMPHADENECTOMY LAPAROSCOPIC PARTIAL GASTRECTOMY: stable Pain:  Pain is well-controlled on PRN medications.  RN notified to give pain medication at this time.  Heme: Hgb 8.7 this morning which is stable.   S/P blood transfusion 1 unit PRBCs  02/12/17. Remains mildly tachycardic but improving.  ID: WBC trending slightly upward, 12 this am.  Zosyn continued. Vanc added on 02/13/17. No clear localizing source, though peritoneal infection most likely given myometritis and contamination of surgical field with purulent uterine tumor.   CV: BP slightly elevated and tachycardic (likely from pain, dehydration, and infection). Continue to monitor with ordered vital signs.  GI:  maalox for heartburn, full liquids.   GU: Adequate output reported.  Foley in place.  Creatinine 1.5 (stable). Will repeat voiding trial before discharge.  Endo: Diabetes mellitus Type II, under good control.  CBG:  CBG (last 3)  Recent Labs    02/14/17 0804 02/14/17 1201 02/14/17 1648  GLUCAP 116* 170* 141*   Prophylaxis: SCDs and Lovenox 30 mg daily  Plan: Cont IVF to D51/2 NS with 10 meq KCL at 100 cc/hr until tolerating better PO maalox full liquids Encourage ambulation, IS use, deep breathing, and coughing Evaluation by PT for possible rehab placement.  Continue zosyn and vanc until 48 hours afebrile. Central line is day 4 (placed 02/10/17). Will need line change at 7-10 days.    LOS: 8 days    [name] 02/14/2017, 6:35 PM Late entry from 9am

## 2017-02-14 NOTE — Care Management Important Message (Signed)
Important Message  Patient Details  Name: Martha Mcdaniel MRN: [id number] Date of Birth: 01/26/1933   Medicare Important Message Given:  Yes    Kerin Salen 02/14/2017, 10:17 AMImportant Message  Patient Details  Name: Martha Mcdaniel MRN: [id number] Date of Birth: 06/24/1933   Medicare Important Message Given:  Yes    Kerin Salen 02/14/2017, 10:16 AM

## 2017-02-14 NOTE — Progress Notes (Signed)
Inpatient Diabetes Program Recommendations  AACE/ADA: New Consensus Statement on Inpatient Glycemic Control (2015)  Target Ranges:  Prepandial:   less than 140 mg/dL      Peak postprandial:   less than 180 mg/dL (1-2 hours)      Critically ill patients:  140 - 180 mg/dL   Results for Martha Mcdaniel, Martha Mcdaniel (MRN [id number]) as of 02/14/2017 07:34  Ref. Range 02/13/2017 00:04 02/13/2017 04:24 02/13/2017 05:17 02/13/2017 07:52 02/13/2017 11:22 02/13/2017 15:46 02/13/2017 20:21  Glucose-Capillary Latest Ref Range: 65 - 99 mg/dL 72 69 123 (H) 80 137 (H) 110 (H) 170 (H)   Results for Martha Mcdaniel, Martha Mcdaniel (MRN [id number]) as of 02/14/2017 07:34  Ref. Range 02/14/2017 00:09 02/14/2017 04:30  Glucose-Capillary Latest Ref Range: 65 - 99 mg/dL 128 (H) 201 (H)    Home DM Meds: Levemir 20 units QHS  Current Insulin Orders: Levemir 16 units QHS                                       Novolog Resistant Correction Scale/ SSI (0-20 units) Q4 hours      Patient with mild Hypoglycemic event yesterday AM after receiving 16 units Levemir the night prior.  RN HELD the scheduled dose of Levemir last PM.  CBG 201 mg/dl at 4:30am today.  CBGs may be elevated today since Levemir held last PM.      MD- Please consider the following in-hospital insulin adjustments:  1. Reduce Levemir slightly to 12 units QHS (may want to change dosing to daily in the AM since pt did not get any Levemir last PM)  2. Reduce Novolog SSI to Moderate scale (0-15 units) Q4 hours      --Will follow patient during hospitalization--  Wyn Quaker RN, MSN, CDE Diabetes Coordinator Inpatient Glycemic Control Team Team Pager: [phone] (8a-5p)

## 2017-02-14 NOTE — Progress Notes (Signed)
Patient resting in bed quietly with eyes closed.  Responding to verbal stimuli.  Reporting moderate abdominal pain with intermittent nausea.  No emesis reported. Flatus and bowel movement today.  Taken in small amount of clear liquids.  Wanting pain medication.  No other concerns voiced.  Tachycardic at 110 bpm, lungs clear, abdomen slightly distended but soft, active bowels sounds, slightly tympanic.  Will advance to full liquids as tolerated per Dr. Denman George. No needs voiced when exiting room.

## 2017-02-14 NOTE — Plan of Care (Signed)
  Elimination: Will not experience complications related to bowel motility 02/14/2017 1653 - Progressing by Dorene Sorrow, RN   Nutrition: Adequate nutrition will be maintained 02/14/2017 1653 - Not Progressing by Dorene Sorrow, RN   Skin Integrity: Risk for impaired skin integrity will decrease 02/14/2017 1653 - Progressing by Dorene Sorrow, RN

## 2017-02-15 LAB — BASIC METABOLIC PANEL
Anion gap: 7 (ref 5–15)
Anion gap: 5 (ref 5–15)
BUN: 7 mg/dL (ref 6–20)
BUN: 7 mg/dL (ref 6–20)
CALCIUM: 8.6 mg/dL — AB (ref 8.9–10.3)
CALCIUM: 8.2 mg/dL — AB (ref 8.9–10.3)
CO2: 22 mmol/L (ref 22–32)
CO2: 22 mmol/L (ref 22–32)
CREATININE: 1.51 mg/dL — AB (ref 0.44–1.00)
Chloride: 108 mmol/L (ref 101–111)
Chloride: 109 mmol/L (ref 101–111)
Creatinine, Ser: 1.41 mg/dL — ABNORMAL HIGH (ref 0.44–1.00)
GFR calc Af Amer: 39 mL/min — ABNORMAL LOW (ref >60)
GFR, EST AFRICAN AMERICAN: 36 mL/min — AB (ref >60)
GFR, EST NON AFRICAN AMERICAN: 31 mL/min — AB (ref >60)
GFR, EST NON AFRICAN AMERICAN: 34 mL/min — AB (ref >60)
GLUCOSE: 151 mg/dL — AB (ref 65–99)
Glucose, Bld: 136 mg/dL — ABNORMAL HIGH (ref 65–99)
Potassium: 3.6 mmol/L (ref 3.5–5.1)
Potassium: 3.6 mmol/L (ref 3.5–5.1)
SODIUM: 137 mmol/L (ref 135–145)
Sodium: 136 mmol/L (ref 135–145)

## 2017-02-15 LAB — CBC WITH DIFFERENTIAL/PLATELET
BASOS ABS: 0 10*3/uL (ref 0.0–0.1)
BASOS PCT: 0 %
EOS PCT: 0 %
Eosinophils Absolute: 0 10*3/uL (ref 0.0–0.7)
HEMATOCRIT: 27.1 % — AB (ref 36.0–46.0)
Hemoglobin: 9 g/dL — ABNORMAL LOW (ref 12.0–15.0)
Lymphocytes Relative: 11 %
Lymphs Abs: 1.4 10*3/uL (ref 0.7–4.0)
MCH: 28 pg (ref 26.0–34.0)
MCHC: 33.2 g/dL (ref 30.0–36.0)
MCV: 84.2 fL (ref 78.0–100.0)
MONO ABS: 0.9 10*3/uL (ref 0.1–1.0)
MONOS PCT: 7 %
Neutro Abs: 10.2 10*3/uL — ABNORMAL HIGH (ref 1.7–7.7)
Neutrophils Relative %: 82 %
PLATELETS: 397 10*3/uL (ref 150–400)
RBC: 3.22 MIL/uL — ABNORMAL LOW (ref 3.87–5.11)
RDW: 16.4 % — AB (ref 11.5–15.5)
WBC: 12.5 10*3/uL — ABNORMAL HIGH (ref 4.0–10.5)

## 2017-02-15 LAB — GLUCOSE, CAPILLARY
GLUCOSE-CAPILLARY: 128 mg/dL — AB (ref 65–99)
GLUCOSE-CAPILLARY: 136 mg/dL — AB (ref 65–99)
GLUCOSE-CAPILLARY: 178 mg/dL — AB (ref 65–99)
GLUCOSE-CAPILLARY: 75 mg/dL (ref 65–99)
GLUCOSE-CAPILLARY: 83 mg/dL (ref 65–99)
Glucose-Capillary: 131 mg/dL — ABNORMAL HIGH (ref 65–99)

## 2017-02-15 MED ORDER — GUAIFENESIN-DM 100-10 MG/5ML PO SYRP
5.0000 mL | ORAL_SOLUTION | Freq: Four times a day (QID) | ORAL | Status: DC | PRN
  Administered 2017-02-15: 05:00:00 5 mL via ORAL
  Filled 2017-02-15: qty 10

## 2017-02-15 MED ORDER — ENOXAPARIN (LOVENOX) PATIENT EDUCATION KIT
PACK | Freq: Once | Status: DC
  Filled 2017-02-15: qty 1

## 2017-02-15 MED ORDER — INSULIN ASPART 100 UNIT/ML ~~LOC~~ SOLN
0.0000 [IU] | Freq: Three times a day (TID) | SUBCUTANEOUS | Status: DC
  Administered 2017-02-16: 08:00:00 3 [IU] via SUBCUTANEOUS

## 2017-02-15 MED ORDER — INSULIN ASPART 100 UNIT/ML ~~LOC~~ SOLN: 0.0000 [IU] | Freq: Every day | SUBCUTANEOUS | Status: DC

## 2017-02-15 MED ORDER — BISACODYL 10 MG RE SUPP: 10.0000 mg | Freq: Once | RECTAL | Status: DC

## 2017-02-15 NOTE — Progress Notes (Signed)
Physical Therapy Treatment Patient Details Name: Martha Mcdaniel MRN: [id number] DOB: [birth year] Today's Date: 02/15/2017    History of Present Illness 84 yo had endometrial biopsy on 10/30/2016 that showed uterine carcinosarcoma. however 02/06/2017 pt began to feel really sick and then admit to hospital undergoing exxploratory laparotomy with bilateral hysterectomy abdominal salpingo-oophorectomy with lympadenectomy, and laparoscopic partial gastrectomy. Currenly NPO with NGT for illeus as well.     PT Comments    Progressing with mobility. Recommend daily ambulation with nursing in addition to PT sessions. Discussed d/c plan-pt stated her daughter is able to assist her as needed at home.    Follow Up Recommendations  No PT follow up;Supervision/Assistance - 24 hour     Equipment Recommendations  Rolling walker with 5" wheels    Recommendations for Other Services       Precautions / Restrictions Precautions Precaution Comments: abd surgery Restrictions Weight Bearing Restrictions: No    Mobility  Bed Mobility Overal bed mobility: Needs Assistance Bed Mobility: Supine to Sit;Sit to Supine     Supine to sit: Min guard;HOB elevated Sit to supine: Mod assist;HOB elevated   General bed mobility comments: no assist given for supine to sit. Assist for bil LEs for sit to supine. Increased time.   Transfers Overall transfer level: Needs assistance Equipment used: Rolling walker (2 wheeled) Transfers: Sit to/from Stand Sit to Stand: Min guard         General transfer comment: increased time  Ambulation/Gait Ambulation/Gait assistance: Min guard Ambulation Distance (Feet): 150 Feet Assistive device: Rolling walker (2 wheeled) Gait Pattern/deviations: Decreased step length - left;Decreased step length - right;Step-through pattern     General Gait Details: slow gait speed. short step lengths bilaterally. several brief standing rest breaks.    Stairs             Wheelchair Mobility    Modified Rankin (Stroke Patients Only)       Balance                                            Cognition Arousal/Alertness: Awake/alert Behavior During Therapy: WFL for tasks assessed/performed Overall Cognitive Status: Within Functional Limits for tasks assessed                                        Exercises      General Comments        Pertinent Vitals/Pain Pain Assessment: 0-10 Pain Score: 7  Pain Location: ABD Pain Descriptors / Indicators: Sore;Operative site guarding Pain Intervention(s): Monitored during session;Heat applied;Repositioned    Home Living                      Prior Function            PT Goals (current goals can now be found in the care plan section) Progress towards PT goals: Progressing toward goals    Frequency    Min 3X/week      PT Plan Current plan remains appropriate    Co-evaluation              AM-PAC PT "6 Clicks" Daily Activity  Outcome Measure  Difficulty turning over in bed (including adjusting bedclothes, sheets and blankets)?: Unable Difficulty moving from lying on back to sitting on  the side of the bed? : Unable Difficulty sitting down on and standing up from a chair with arms (e.g., wheelchair, bedside commode, etc,.)?: Unable Help needed moving to and from a bed to chair (including a wheelchair)?: A Little Help needed walking in hospital room?: A Little Help needed climbing 3-5 steps with a railing? : A Little 6 Click Score: 12    End of Session   Activity Tolerance: Patient tolerated treatment well Patient left: in bed;with call bell/phone within reach   PT Visit Diagnosis: Muscle weakness (generalized) (M62.81)     Time: 3295-1884 PT Time Calculation (min) (ACUTE ONLY): 33 min  Charges:  $Gait Training: 23-37 mins                    G Codes:          Weston Anna, MPT Pager: [phone]

## 2017-02-15 NOTE — Progress Notes (Signed)
9 Days Post-Op Procedure(s) (LRB): EXPLORATORY LAPAROTOMY (N/A) HYSTERECTOMY ABDOMINAL WITH SALPINGO-OOPHORECTOMY (Bilateral) LYMPHADENECTOMY (N/A) LAPAROSCOPIC PARTIAL GASTRECTOMY (N/A)  Subjective: Patient reports doing better this am.  Stating abdominal pain is improving with level at a 7 currently.  Had loose stool this am but no flatus reported. Ambulating without difficulty with assist. No nausea or emesis this am.  Planning on eating some grits after working with PT.  States her cough was "temporary" and the cough medicine has helped.  No concerns voiced.      Objective: Vital signs in last 24 hours: Temp:  [98.9 F (37.2 C)-100.2 F (37.9 C)] 99 F (37.2 C) (02/07 0800) Pulse Rate:  [102-114] 102 (02/07 0800) Resp:  [12-14] 12 (02/06 2221) BP: (140-164)/(63-73) 152/65 (02/07 0800) SpO2:  [99 %-100 %] 100 % (02/07 0800) Weight:  [163 lb 2.3 oz (74 kg)] 163 lb 2.3 oz (74 kg) (02/06 2221) Last BM Date: (P) 02/15/17  Intake/Output from previous day: 02/06 0701 - 02/07 0700 In: 2225 [P.O.:90; I.V.:1685; IV Piggyback:450] Out: 1950 [Urine:1950]  Physical Examination: General: alert, cooperative and no distress Resp: clear to auscultation bilaterally Cardio: midly tachycardic at 854, no murmurs, clicks, or rubs GI: soft, slightly distended, tympanic, active bowel sounds Extremities: mild bilateral non-pitting pedal edema, no cyanosis Foley to straight drain with clear, yellow urine   Labs:   BUN/Cr/glu/ALT/AST/amyl/lip:  7/1.51/--/--/--/--/-- (02/07 0619)  Assessment: 84 y.o. s/p Procedure(s): EXPLORATORY LAPAROTOMY HYSTERECTOMY ABDOMINAL WITH SALPINGO-OOPHORECTOMY LYMPHADENECTOMY LAPAROSCOPIC PARTIAL GASTRECTOMY: stable Pain:  Pain is well-controlled on PRN medications.  Heme: Hgb 8.7 and Hct 26.6 from 02/15/16.  Stable.  S/P transfusion of PRBCs on 2/2-2/3 and 1 unit on 2/4. Mildly tachycardic but improving.  ID: WBC 12.1 this am.  Blood cultures pending with  preliminary results with no growth.  Vancomycin IV ordered along with Zosyn. Elevated temperatures improving.    CV: Tachycardia improving.  BP stable and slightly elevated at times. Continue to monitor with vital sign assessments.  GI:  Tolerating po: Yes.  Ileus resolved.  BM this am.    GU: Foley in place due to urinary retention after two failed voiding trials.  Attempt voiding trial closer to discharge.  Creatinine 1.51 this am (stable).    FEN: Hypokalemia improved, 3.6 this am. Continue to monitor.  Endo: Diabetes mellitus Type II, under good control..  CBG:  CBG (last 3)  Recent Labs    02/15/17 0423 02/15/17 0804 02/15/17 1209  GLUCAP 178* 131* 136*   Prophylaxis: pharmacologic prophylaxis (with any of the following: lovenox 30 mg SQ daily) and intermittent pneumatic compression boots.  Plan: Diet to carb modified  Saline lock IV when adequate PO intake Suppository per Dr. Delsa Sale to assist with relief of flatus Encourage ambulation, IS use, deep breathing, and coughing Continue plan of care per Dr. Delsa Sale Plan for possible discharge tomorrow if remains afebrile, diet tolerated   LOS: 9 days    Martha Mcdaniel D Martha Mcdaniel 02/15/2017, 11:32 AM

## 2017-02-15 NOTE — Plan of Care (Signed)
Not eating well

## 2017-02-16 LAB — GLUCOSE, CAPILLARY
GLUCOSE-CAPILLARY: 157 mg/dL — AB (ref 65–99)
Glucose-Capillary: 104 mg/dL — ABNORMAL HIGH (ref 65–99)
Glucose-Capillary: 138 mg/dL — ABNORMAL HIGH (ref 65–99)

## 2017-02-16 MED ORDER — TRAMADOL HCL 50 MG PO TABS: 50.0000 mg | ORAL_TABLET | Freq: Four times a day (QID) | ORAL | 0 refills | Status: AC | PRN

## 2017-02-16 MED ORDER — AMOXICILLIN-POT CLAVULANATE 875-125 MG PO TABS: 1.0000 | ORAL_TABLET | Freq: Two times a day (BID) | ORAL | 0 refills | Status: DC

## 2017-02-16 MED ORDER — AMOXICILLIN-POT CLAVULANATE 875-125 MG PO TABS: 1.0000 | ORAL_TABLET | Freq: Two times a day (BID) | ORAL | 0 refills | Status: AC

## 2017-02-16 MED ORDER — ENOXAPARIN SODIUM 30 MG/0.3ML ~~LOC~~ SOLN: 30.0000 mg | SUBCUTANEOUS | 0 refills | Status: AC

## 2017-02-16 NOTE — Discharge Summary (Signed)
Physician Discharge Summary  Patient ID: Martha Mcdaniel MRN: [id number] DOB/AGE: [birth year] 84 y.o.  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    Hospital Course: On 02/06/2017, the patient underwent the following: Procedure(s): EXPLORATORY LAPAROTOMY HYSTERECTOMY ABDOMINAL WITH SALPINGO-OOPHORECTOMY LYMPHADENECTOMY LAPAROSCOPIC PARTIAL GASTRECTOMY.  The postoperative course was complicated with post-operative ileus requiring decompression with NG tube placement/bowel rest, elevated temperature with leukocytosis, anemia s/p transfusion, two failed voiding trials with foley replacement due to retention but able to void after removal on discharge date.  Blood cultures were obtained 02/13/17 due to elevated temperatures with no growth at this time.  She was discharged to home on postoperative day 10 tolerating a regular diet, voiding, passing flatus and having bowel movements, pain controlled, ambulating without difficulty.  She is discharged home with daily lovenox for a total of 28 days post-op and Augmentin for 2 weeks.    Consults: Physical Therapy  Significant Diagnostic Studies: CT chest, AP 02/10/17 resulting IMPRESSION: 1. Postop changes of interval hysterectomy with scattered pelvic ascites, no discrete loculated or enhancing collection to suggest abscess. 2. Dilated small bowel loops without discrete transition point or evidence of mechanical obstruction, suggesting adynamic ileus. 3. Negative for acute PE or thoracic aortic dissection. 4. Solitary 0.8 cm left upper lobe pulmonary nodule, possibly a metastatic focus given  clinical history. Follow-up recommended. 5. Small bilateral pleural effusions. Chest xray 02/14/17 resulting IMPRESSION: No edema or consolidation.  No pneumothorax.   Treatments: surgery: see above, NG tube placement  Discharge Exam: Blood pressure (!) 132/48, pulse 87, temperature 99.2 F (37.3 C), temperature source Oral, resp. rate 17, height 5\' 4"  (1.626 m), weight 162 lb 7.7 oz (73.7 kg), SpO2 100 %. General appearance: alert, cooperative and no distress Resp: scattered upper airway rhonchi cleared with cough, bases of lungs clear Cardio: regular rate and rhythm at 92 bpm, no murmurs/clicks/rubs GI: abdomen soft, active bowel sounds, non-tympanic, slightly distended Extremities: bilateral pedal mild edema, non-pitting Incision/Wound: Midline incision with dressing present, lap sites with dermabond without erythema or drainage  Disposition: 01-Home or Self Care  Discharge Instructions    Call MD for:  difficulty breathing, headache or visual disturbances   Complete by:  As directed    Call MD for:  extreme fatigue   Complete by:  As directed    Call MD for:  hives   Complete by:  As directed    Call MD for:  persistant dizziness or light-headedness   Complete by:  As directed    Call MD for:  persistant nausea and vomiting   Complete by:  As directed    Call MD for:  redness, tenderness, or signs of infection (pain, swelling, redness, odor or green/yellow discharge around incision site)   Complete by:  As directed    Call MD for:  severe uncontrolled pain   Complete by:  As directed    Call MD for:  temperature >100.4   Complete by:  As directed    Diet - low sodium heart healthy   Complete by:  As directed    Driving Restrictions   Complete by:  As directed    No driving for  2 weeks from surgery.  Do not take narcotics and drive.   Increase activity slowly   Complete by:  As directed    Lifting restrictions   Complete by:  As directed    No lifting greater than 10 lbs.    Sexual Activity Restrictions   Complete by:  As directed    No sexual activity, nothing in the vagina, for 6 weeks.     Allergies as of 02/16/2017      Reactions   Ibuprofen Other (See Comments)   Pt feels disoriented with medication      Medication List    TAKE these medications   acetaminophen 325 MG tablet Commonly known as:  TYLENOL Take 650 mg by mouth every 6 (six) hours as needed for mild pain or moderate pain.   amoxicillin-clavulanate 875-125 MG tablet Commonly known as:  AUGMENTIN Take 1 tablet by mouth 2 (two) times daily.   enoxaparin 30 MG/0.3ML injection Commonly known as:  LOVENOX Inject 0.3 mLs (30 mg total) into the skin daily. Start taking on:  02/17/2017   insulin detemir 100 UNIT/ML injection Commonly known as:  LEVEMIR Inject 20 Units into the skin at bedtime.   Iron 325 (65 Fe) MG Tabs Take 1 tablet (325 mg total) by mouth 2 (two) times daily.   lisinopril 20 MG tablet Commonly known as:  PRINIVIL,ZESTRIL Take 20 mg by mouth daily.   pantoprazole 40 MG tablet Commonly known as:  PROTONIX Take 1 tablet (40 mg total) by mouth 2 (two) times daily.   traMADol 50 MG tablet Commonly known as:  ULTRAM Take 1 tablet (50 mg total) by mouth every 6 (six) hours as needed.   VOLTAREN 1 % Gel Generic drug:  diclofenac sodium Apply 1 application topically 4 (four) times daily as needed (pain).            Durable Medical Equipment  (From admission, onward)        Start     Ordered   02/16/17 1015  For home use only DME Walker rolling  Once    Question:  Patient needs a walker to treat with the following condition  Answer:  Weakness   02/16/17 1015   02/15/17 1655  For home use only DME Walker rolling  Once    Question:  Patient needs a walker to treat with the following condition  Answer:  Weakness generalized   02/15/17 1655       Greater than thirty minutes were spend for face to face discharge instructions and discharge orders/summary in  EPIC.   Signed: Dorothyann Gibbs 02/16/2017, 2:40 PM

## 2017-02-16 NOTE — Progress Notes (Signed)
Discharge instructions given to  son, including lovenox education and administration questions ask and answered. Bethann Punches RN

## 2017-02-16 NOTE — Discharge Instructions (Signed)
02/16/2017  Return to work: 4-6 weeks if applicable  Activity: 1. Be up and out of the bed during the day.  Take a nap if needed.  You may walk up steps but be careful and use the hand rail.  Stair climbing will tire you more than you think, you may need to stop part way and rest.   2. No lifting or straining for 6 weeks.  3. No driving for 2 week(s) from surgery if cleared to drive previous.  Do not drive if you are taking narcotic pain medicine.  4. Shower daily.  Use soap and water on your incision and pat dry; don't rub.  No tub baths until cleared by your surgeon.   5. No sexual activity and nothing in the vagina for 6 weeks.  6. You may experience a small amount of clear drainage from your incision, which is normal.  If the drainage persists or increases, please call the office.  7. You may experience vaginal spotting after surgery or around the 6-8 week mark from surgery when the stitches at the top of the vagina begin to dissolve.  The spotting is normal but if you experience heavy bleeding, call our office.  8. Take Tylenol or ibuprofen first for pain and use tramadol for severe pain not relieved by the Tylenol or Ibuprofen.    Diet: 1. Low sodium Heart Healthy Diet is recommended.  2. It is safe to use a laxative, such as Miralax or Colace, if you have difficulty moving your bowels. You can take Sennakot at bedtime every evening to keep bowel movements regular and to prevent constipation.    Wound Care: 1. Keep clean and dry.  Shower daily.  Reasons to call the Doctor:  Fever - Oral temperature greater than 100.4 degrees Fahrenheit  Foul-smelling vaginal discharge  Difficulty urinating  Nausea and vomiting  Increased pain at the site of the incision that is unrelieved with pain medicine.  Difficulty breathing with or without chest pain  New calf pain especially if only on one side  Sudden, continuing increased vaginal bleeding with or without clots.    Contacts: For questions or concerns you should contact:  Dr. Everitt Amber at [phone]  Joylene John, NP at [phone]  After Hours: call [phone] and have the GYN Oncologist paged/contacted   How and Where to Give Subcutaneous Enoxaparin Injections Enoxaparin is an injectable medicine. It is used to help prevent blood clots from developing in your veins. Health care providers often use anticoagulants like enoxaparin to prevent clots following surgery. Enoxaparin is also used in combination with other medicines to treat blood clots and heart attacks. If blood clots are left untreated, they can be life threatening. Enoxaparin comes in single-use syringes. You inject enoxaparin through a syringe into your belly (abdomen). You should change the injection site each time you give yourself a shot. Continue the enoxaparin injections as directed by your health care provider. Your health care provider will use blood clotting test results to decide when you can safely stop using enoxaparin injections. If your health care provider prescribes any additional medicines, use the medicines exactly as directed. How do I inject enoxaparin? 9. Wash your hands with soap and water. 10. Clean the selected injection site as directed by your health care provider. 11. Remove the needle cap by pulling it straight off the syringe. 12. When using a prefilled syringe, do not push the air bubble out of the syringe before the injection. The air bubble will help  you get all of the medicine out of the syringe. 13. Hold the syringe like a pencil using your writing hand. 14. Use your other hand to pinch and hold an inch of the cleansed skin. 15. Insert the entire needle straight down into the fold of skin. 16. Push the plunger with your thumb until the syringe is empty. 17. Pull the needle straight out of your skin. 18. Enoxaparin injection prefilled syringes and graduated prefilled syringes are available with a  system that shields the needle after injection. After you have completed your injection and removed the needle from your skin, firmly push down on the plunger. The protective sleeve will automatically cover the needle and you will hear a click. The click means the needle is safely covered. 19. Place the syringe in the nearest needle box, also called a sharps container. If you do not have a sharps container, you can use a hard-sided plastic container with a secure lid, such as an empty laundry detergent bottle. What else do I need to know?  Do not use enoxaparin if: ? You have allergies to heparin or pork products. ? You have been diagnosed with a condition called thrombocytopenia.  Do not use the syringe or needle more than one time.  Use medicines only as directed by your health care provider.  Changes in medicines, supplements, diet, and illness can affect your anticoagulation therapy. Be sure to inform your health care provider of any of these changes.  It is important that you tell all of your health care providers and your dentist that you are taking an anticoagulant, especially if you are injured or plan to have any type of procedure.  While on anticoagulants, you will need to have blood tests done routinely as directed by your health care provider.  While using this medicine, avoid physical activities or sports that could result in a fall or cause injury.  Follow up with your laboratory test and health care provider appointments as directed. It is very important to keep your appointments. Not keeping appointments could result in a chronic or permanent injury, pain, or disability.  Before giving your medicine, you should make sure the injection is a clear and colorless or pale yellow solution. If your medicine becomes discolored or if there are particles in the syringe, do not use it and notify your health care provider.  Keep your medicine safely stored at room temperature. Contact a  health care provider if:  You develop any rashes on your skin.  You have large areas of bruising on your skin.  You have any worsening of the condition for which you take Enoxaparin.  You develop a fever. Get help right away if:  You develop bleeding problems such as: ? Bleeding from the gums or nose that does not stop quickly. ? Vomiting blood or coughing up blood. ? Blood in your urine. ? Blood in your stool, or stool that has a dark, tarry, or coffee grounds appearance. ? A cut that does not stop bleeding within 10 minutes. These symptoms may represent a serious problem that is an emergency. Do not wait to see if the symptoms will go away. Get medical help right away. Call your local emergency services (911 in the [place] Do not drive yourself to the hospital. This information is not intended to replace advice given to you by your health care provider. Make sure you discuss any questions you have with your health care provider. Document Released: 10/28/2003 Document Revised: 09/02/2015 Document Reviewed:  06/12/2013 Elsevier Interactive Patient Education  2018 [employer].  Amoxicillin; Clavulanic Acid chewable tablets What is this medicine? AMOXICILLIN; CLAVULANIC ACID (a mox i SIL in; KLAV yoo lan ic AS id) is a penicillin antibiotic. It is used to treat certain kinds of bacterial infections. It It will not work for colds, flu, or other viral infections. This medicine may be used for other purposes; ask your health care provider or pharmacist if you have questions. COMMON BRAND NAME(S): Augmentin What should I tell my health care provider before I take this medicine? They need to know if you have any of these conditions: -bowel disease, like colitis -kidney disease -liver disease -mononucleosis -phenylketonuria -an unusual or allergic reaction to amoxicillin, penicillin, cephalosporin, other antibiotics, clavulanic acid, other medicines, foods, dyes, or preservatives -pregnant  or trying to get pregnant -breast-feeding How should I use this medicine? Take this medicine by mouth. Chew it completely before swallowing. Follow the directions on the prescription label. Take this medicine at the start of a meal or snack. Take your medicine at regular intervals. Do not take your medicine more often than directed. Take all of your medicine as directed even if you think you are better. Do not skip doses or stop your medicine early. Talk to your pediatrician regarding the use of this medicine in children. While this drug may be prescribed for selected conditions, precautions do apply. Overdosage: If you think you have taken too much of this medicine contact a poison control center or emergency room at once. NOTE: This medicine is only for you. Do not share this medicine with others. What if I miss a dose? If you miss a dose, take it as soon as you can. If it is almost time for your next dose, take only that dose. Do not take double or extra doses. What may interact with this medicine? -allopurinol -anticoagulants -birth control pills -methotrexate -probenecid This list may not describe all possible interactions. Give your health care provider a list of all the medicines, herbs, non-prescription drugs, or dietary supplements you use. Also tell them if you smoke, drink alcohol, or use illegal drugs. Some items may interact with your medicine. What should I watch for while using this medicine? Tell your doctor or health care professional if your symptoms do not improve. Do not treat diarrhea with over the counter products. Contact your doctor if you have diarrhea that lasts more than 2 days or if it is severe and watery. If you have diabetes, you may get a false-positive result for sugar in your urine. Check with your doctor or health care professional. Birth control pills may not work properly while you are taking this medicine. Talk to your doctor about using an extra method of  birth control. What side effects may I notice from receiving this medicine? Side effects that you should report to your doctor or health care professional as soon as possible: -allergic reactions like skin rash, itching or hives, swelling of the face, lips, or tongue -breathing problems -dark urine -fever or chills, sore throat -redness, blistering, peeling or loosening of the skin, including inside the mouth -seizures -trouble passing urine or change in the amount of urine -unusual bleeding, bruising -unusually weak or tired -white patches or sores in the mouth or throat Side effects that usually do not require medical attention (report to your doctor or health care professional if they continue or are bothersome): -diarrhea -dizziness -headache -nausea, vomiting -stomach upset -vaginal or anal irritation This list may not  describe all possible side effects. Call your doctor for medical advice about side effects. You may report side effects to FDA at 1-800-FDA-1088. Where should I keep my medicine? Keep out of the reach of children. Store at room temperature below 25 degrees C (77 degrees F). Keep container tightly closed. Throw away any unused medicine after the expiration date. NOTE: This sheet is a summary. It may not cover all possible information. If you have questions about this medicine, talk to your doctor, pharmacist, or health care provider.  2018 Elsevier/Gold Standard (2007-03-21 11:38:22)

## 2017-02-16 NOTE — Progress Notes (Signed)
10 Days Post-Op Procedure(s) (LRB): EXPLORATORY LAPAROTOMY (N/A) HYSTERECTOMY ABDOMINAL WITH SALPINGO-OOPHORECTOMY (Bilateral) LYMPHADENECTOMY (N/A) LAPAROSCOPIC PARTIAL GASTRECTOMY (N/A)  Subjective: Patient reports doing better this am.  Pain has improved.  Loose stools have slowed down.  Passing flatus with has helped with abdominal symptom relief.  Ambulating without difficulty with assist. No nausea or emesis this am.  Cough is "not better but not worse."  No concerns voiced.      Objective: Vital signs in last 24 hours: Temp:  [98.3 F (36.8 C)-100.1 F (37.8 C)] 99.2 F (37.3 C) (02/08 0532) Pulse Rate:  [87-104] 87 (02/08 0532) Resp:  [14-17] 17 (02/08 0532) BP: (132-162)/(48-98) 132/48 (02/08 0532) SpO2:  [98 %-100 %] 100 % (02/08 0532) Weight:  [162 lb 7.7 oz (73.7 kg)] 162 lb 7.7 oz (73.7 kg) (02/08 0532) Last BM Date: 02/15/17  Intake/Output from previous day: 02/07 0701 - 02/08 0700 In: 4055 [P.O.:1340; I.V.:2415; IV Piggyback:300] Out: 2650 [Urine:2650]  Physical Examination: General: alert, cooperative and no distress Resp: scattered rhonchi that improves with deep cough, lungs clear at the bases Cardio: regular rate and rhythm at 92 bpm, no murmurs, clicks, or rubs GI: soft, slightly distended, non tympanic, active bowel sounds Extremities: mild bilateral non-pitting pedal edema, no cyanosis Foley to straight drain with clear, yellow urine   Labs: WBC/Hgb/Hct/Plts:  12.5/9.0/27.1/397 (02/07 1237) BUN/Cr/glu/ALT/AST/amyl/lip:  7/1.41/--/--/--/--/-- (02/07 1237)  Assessment: 84 y.o. s/p Procedure(s): EXPLORATORY LAPAROTOMY HYSTERECTOMY ABDOMINAL WITH SALPINGO-OOPHORECTOMY LYMPHADENECTOMY LAPAROSCOPIC PARTIAL GASTRECTOMY: stable Pain:  Pain is well-controlled on PRN medications.  Heme: Hgb 9.0 and Hct 27.1 from 02/15/17.  Stable.  S/P transfusion of PRBCs on 2/2-2/3 and 1 unit on 2/4. Mildly tachycardic but improving.  ID: WBC 12.5 this am.  Blood cultures  pending with preliminary results with no growth.  Vancomycin IV ordered along with Zosyn. Elevated temperatures improving.    CV: Tachycardia improved.  BP stable. Continue to monitor with vital sign assessments.  GI:  Tolerating po: Yes.  Ileus resolved.  BM this am.    GU: One void this am after foley removal.  Creatinine 1.41 this am.  FEN: Hypokalemia improved, 3.6 on 02/15/17. Continue to monitor.  Endo: Diabetes mellitus Type II, under good control..  CBG:  CBG (last 3)  Recent Labs    02/15/17 2021 02/16/17 0730 02/16/17 1106  GLUCAP 83 157* 104*   Prophylaxis: pharmacologic prophylaxis (with any of the following: lovenox 30 mg SQ daily) and intermittent pneumatic compression boots.  Plan: Diet as tolerated Saline lock IV  Encourage ambulation, IS use, deep breathing, and coughing Continue plan of care per Dr. Delsa Sale Plan for possible discharge later today   LOS: 10 days    Sajad Glander D Daivd Fredericksen 02/16/2017, 11:39 AM

## 2017-02-16 NOTE — Progress Notes (Signed)
Pharmacy Antibiotic Note  Martha Mcdaniel is a 84 y.o. 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   Pharmacy has been consulted for vancomycin dosing for sepsis. Also on Zosyn.  02/16/2017:  Day #7 antibiotics  Mild leukocytosis- WBC 12.5  Renal function stable   Afebrile  Cx data negative  Plan: Zosyn 3.375g IV q8h (4 hour infusion). - continues per MD Vancomycin 1000 mg IV q48 (goal AUC 400-500) F/u renal fxn, wbc, temp, culture data Noted plans for possible discharge today- If abx are continued, recommend d/c or de-escalate   Height: 5\' 4"  (162.6 cm) Weight: 162 lb 7.7 oz (73.7 kg) IBW/kg (Calculated) : 54.7  Temp (24hrs), Avg:99.2 F (37.3 C), Min:98.3 F (36.8 C), Max:100.1 F (37.8 C)  Recent Labs  Lab 02/11/17 0401 02/12/17 0404 02/13/17 0437 02/14/17 0345 02/15/17 0619 02/15/17 1237  WBC 14.9* 13.4* 11.6* 12.1*  --  12.5*  CREATININE 1.37* 1.34* 1.42* 1.51* 1.51* 1.41*    Estimated Creatinine Clearance: 30.8 mL/min (A) (by C-G formula based on SCr of 1.41 mg/dL (H)).    Allergies  Allergen Reactions  . Ibuprofen Other (See Comments)    Pt feels disoriented with medication     Antimicrobials this admission: Zosyn 1/31 x 1 dose, 2/3>>> vanc 2/5>> Dose adjustments this admission:  Microbiology results: None  Thank you for allowing pharmacy to be a part of this patient's care.  Netta Cedars, PharmD, BCPS Pager: [phone] 02/16/2017 1:12 PM

## 2017-02-18 LAB — CULTURE, BLOOD (ROUTINE X 2)
Culture: NO GROWTH
Culture: NO GROWTH

## 2017-02-26 ENCOUNTER — Telehealth: Payer: Self-pay | Admitting: Gynecologic Oncology

## 2017-02-26 NOTE — Telephone Encounter (Signed)
Called to check on patients status. Patient's daughter states she is doing well.  Bowels functioning.  Appt for Wed. Moved up to 10:45.  No concerns voiced.

## 2017-02-26 NOTE — Telephone Encounter (Signed)
Duplicate

## 2017-02-28 ENCOUNTER — Ambulatory Visit: Payer: Medicare Other | Admitting: Gynecologic Oncology

## 2017-02-28 ENCOUNTER — Inpatient Hospital Stay: Payer: Medicare Other | Attending: Gynecologic Oncology | Admitting: Gynecologic Oncology

## 2017-02-28 ENCOUNTER — Encounter: Payer: Self-pay | Admitting: Gynecologic Oncology

## 2017-02-28 ENCOUNTER — Telehealth: Payer: Self-pay | Admitting: *Deleted

## 2017-02-28 ENCOUNTER — Encounter: Payer: Self-pay | Admitting: Radiation Oncology

## 2017-02-28 VITALS — BP 145/71 | HR 109 | Temp 98.6°F | Resp 20 | Wt 143.5 lb

## 2017-02-28 DIAGNOSIS — Z794 Long term (current) use of insulin: Secondary | ICD-10-CM | POA: Insufficient documentation

## 2017-02-28 DIAGNOSIS — E43 Unspecified severe protein-calorie malnutrition: Secondary | ICD-10-CM

## 2017-02-28 DIAGNOSIS — C49A2 Gastrointestinal stromal tumor of stomach: Secondary | ICD-10-CM | POA: Diagnosis not present

## 2017-02-28 DIAGNOSIS — A58 Granuloma inguinale: Secondary | ICD-10-CM

## 2017-02-28 DIAGNOSIS — C541 Malignant neoplasm of endometrium: Secondary | ICD-10-CM | POA: Diagnosis not present

## 2017-02-28 DIAGNOSIS — Z7189 Other specified counseling: Secondary | ICD-10-CM

## 2017-02-28 DIAGNOSIS — E119 Type 2 diabetes mellitus without complications: Secondary | ICD-10-CM | POA: Insufficient documentation

## 2017-02-28 DIAGNOSIS — L929 Granulomatous disorder of the skin and subcutaneous tissue, unspecified: Secondary | ICD-10-CM

## 2017-02-28 NOTE — Telephone Encounter (Signed)
Called and spoke to the patient's daughter, gave the information for the appt with Dr. Sharlynn Oliphant. The appt is on February 25th at 1pm with Dr. Sharlynn Oliphant at [facility].

## 2017-02-28 NOTE — Patient Instructions (Addendum)
Dr Denman George is recommending at least 6 doses of chemotherapy and 5 treatments of vaginal radiation to treat your stage 3 uterine cancer. The cancer was found in the uterus and vagina and lymph nodes and will spread if not treated with chemotherapy.  Dr Serita Grit office will schedule you to see Dr Sharlynn Oliphant in [facility] for discussion regarding chemotherapy. Dr Denman George will see you back after completion of chemotherapy.  Dr Serita Grit office will also refer you to see Dr Sondra Come from Radiation Oncology at [facility] to discuss vaginal brachytherapy. Your appointment with Dr. Sondra Come at the [facility] in [place] is on March 6 at 10:30am.

## 2017-02-28 NOTE — Progress Notes (Signed)
New Patient Note: Gyn-Onc  New Patient evaluation of Martha Mcdaniel 84 y.o. female  CC:  Chief Complaint  Patient presents with  . Carcinosarcoma of endometrium [facility])    Assessment/Plan:  [name]  is a 84 y.o.  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  I explained to the patient and her family that surgery is not curative for an advanced stage carcinosarcoma.  I explained that she will require curative intent chemotherapy with carboplatin paclitaxel for at least 6 doses.  I would also recommend vaginal brachytherapy given that she had extensive involvement of the upper vagina by her tumor.  This means that she is at a particular high risk for local failure in the vagina.  Given her advanced endometrial cancer, I do not feel that there is much role for whole pelvic radiation as this will limit the efficacy of chemotherapy and there is no survival advantage for whole pelvic radiotherapy and advanced high-grade endometrial cancer.  We will facilitate referral to Dr. Darrin Luis in [facility] for chemotherapy at the patient's request.  I also facilitate referral to Dr. Parthenia Ames at the current cancer center for consultation and consideration of vaginal brachytherapy.  I will see her back for further evaluation and surveillance after completing therapy.  Protein wasting malnutition: counseled regarding optimizing diet.    HPI: [name] is an 84 year old P3 who is seen for a new patient consultation for uterine carcinosarcoma.  The patient is a resident of the [place] (though her daughter lives in [place], [place] where she spends half her year). The patient developed some vaginal bleeding in October, 2018 and was admitted to a hospital in [place].  CT scan of the abdomen and pelvis on 10/25/16 showed normal lung bases, normal liver and spleen. A 3.5x3.5x4.9cm soft tissue mass was seen abutting the distal  pancreas and arising from the fundus of the stomach "suggestive of gastroinstinal stromal tumor".  There was a cystic 4cm lesion in the right kidney (which on separate [place] appeared benign). The Uterus was markedly enlarged with a thickened endometrium at 3.7cm.  Endometrial biopsy on10/22/18 showed uterine carcinosarcoma. Pelvic US on 10/25/16 showed a uterus measuring 13x8.5x6.7cm with an endometrium that measured 3.6cm.  EGD on 10/26/16 showed a gastric ulcer which was biopsied and found to be benign.   Lab work from that hospitalization showed a WBC elevated to 15,000, with a hemoglobin of 10.2. Platelets were normal.  The white count normalized throughout there stay. But the Hb decreased to 8.3. On 10/25/16 her PTT was elevated at 15, and INR was 1.6 (the patient denies being treated with anticoagulants). Her creatinine was 1.9 and her glucose 219.  ALT was mildly elevated at 57.  HbA1C was very elevated at 9.8 on 0/18/18. She has poorly controlled diabetes and admits to being noncompliant with meds. Her insulin was adjusted during that hospital stay.     Interval History:   She was admitted to the hospital for treatment of active endometritis and treated with IV antibiotics in December, 2018.  On 02/06/17 she underwent laparoscopic partial gastrectomy to resect a 5.3 cm gastrointestinal stromal tumor with Dr. Barry Dienes.  Final pathology of this revealed a gastric tumor features spindle morphology with mild to moderate nuclear atypia no coagulative tumor necrosis and mitotic count of less than 2 per 50 high-power fields.  It was consistent with a low to intermediate risk tumor.  On the  same day Dr. Denman George performed exploratory laparotomy with total abdominal hysterectomy BSO, lymph node dissection, and resection of vaginal mass.  The patient had an extremely large uterus measuring 860 g with tumor that was a carcinosarcoma spanning the entire thickness of the myometrium.  It was involving the  cervix grossly and extending into the upper vagina.  There were clinically positive and enlarged lymph nodes bilaterally and in the aortic chain.  The right obturator lymph node was resected for diagnostic confirmation though the left and para-aortic lymph nodes remained in situ.  Final pathology of the uterine specimen lymph nodes and vagina mass revealed a stage IIIc1 (clinial stage IIIC2) carcinosarcoma of the uterus.  Postoperatively she had a prolonged hospital stay due to postoperative ileus requiring is a gastric tube decompression. She also developed surgical site infection in the pelvis for which she was treated with antibiotic therapy.  After being discharged from hospital she has done very well she reports slowly returning appetite though she still remains malnourished with weight loss and generalized poor appetite.  She denies vaginal bleeding or drainage.  Current Meds:  Outpatient Encounter Medications as of 82/20/2019  Medication Sig  . acetaminophen (TYLENOL) 325 MG tablet Take 650 mg by mouth every 6 (six) hours as needed for mild pain or moderate pain.  Marland Kitchen amoxicillin-clavulanate (AUGMENTIN) 875-125 MG tablet Take 1 tablet by mouth 2 (two) times daily.  . empagliflozin (JARDIANCE) 10 MG TABS tablet Take 10 mg by mouth daily.  Marland Kitchen enoxaparin (LOVENOX) 30 MG/0.3ML injection Inject 0.3 mLs (30 mg total) into the skin daily.  . Ferrous Sulfate (IRON) 325 (65 Fe) MG TABS Take 1 tablet (325 mg total) by mouth 2 (two) times daily.  Marland Kitchen lisinopril (PRINIVIL,ZESTRIL) 20 MG tablet Take 20 mg by mouth daily.  . traMADol (ULTRAM) 50 MG tablet Take 1 tablet (50 mg total) by mouth every 6 (six) hours as needed.  . [DISCONTINUED] diclofenac sodium (VOLTAREN) 1 % GEL Apply 1 application topically 4 (four) times daily as needed (pain).   . [DISCONTINUED] insulin detemir (LEVEMIR) 100 UNIT/ML injection Inject 20 Units into the skin at bedtime.   . [DISCONTINUED] pantoprazole (PROTONIX) 40 MG tablet  Take 1 tablet (40 mg total) by mouth 2 (two) times daily.   No facility-administered encounter medications on file as of 82/20/2019.     Allergy:  Allergies  Allergen Reactions  . Ibuprofen Other (See Comments)    Pt feels disoriented with medication     Social Hx:   Social History   Socioeconomic History  . Marital status: Married    Spouse name: Not on file  . Number of children: Not on file  . Years of education: Not on file  . Highest education level: Not on file  Social Needs  . Financial resource strain: Not on file  . Food insecurity - worry: Not on file  . Food insecurity - inability: Not on file  . Transportation needs - medical: Not on file  . Transportation needs - non-medical: Not on file  Occupational History  . Not on file  Tobacco Use  . Smoking status: Former Smoker    Packs/day: 1.00    Years: 5.00    Pack years: 5.00    Types: Cigarettes  . Smokeless tobacco: Never Used  . Tobacco comment: quit 2008  Substance and Sexual Activity  . Alcohol use: No    Frequency: Never    Comment: Sober quit drinking  . Drug use: No  . Sexual  activity: No  Other Topics Concern  . Not on file  Social History Narrative  . Not on file    Past Surgical Hx:  Past Surgical History:  Procedure Laterality Date  . ENDOMETRIAL BIOPSY    . EUS N/A 01/18/2017   Procedure: UPPER ENDOSCOPIC ULTRASOUND (EUS) LINEAR;  Surgeon: Milus Banister, MD;  Location: [facility];  Service: Endoscopy;  Laterality: N/A;  . EYE SURGERY Left 2016  . Gastric ulcer biopsy    . LAPAROSCOPIC PARTIAL GASTRECTOMY N/A 02/06/2017   Procedure: LAPAROSCOPIC PARTIAL GASTRECTOMY;  Surgeon: Stark Klein, MD;  Location: [facility];  Service: General;  Laterality: N/A;  . LAPAROTOMY N/A 02/06/2017   Procedure: EXPLORATORY LAPAROTOMY;  Surgeon: Everitt Amber, MD;  Location: [facility];  Service: Gynecology;  Laterality: N/A;  . LYMPH NODE DISSECTION N/A 02/06/2017   Procedure: LYMPHADENECTOMY;  Surgeon: Everitt Amber, MD;  Location: [facility];  Service: Gynecology;  Laterality: N/A;    Past Medical Hx:  Past Medical History:  Diagnosis Date  . Anemia   . Arthritis    hands  . Cancer ([place]) dx dec 2018   cervical  . Cataract    RIGHT  . CKD (chronic kidney disease), stage III ([place])   . Diabetes mellitus without complication ([place])    type2  . Gastric mass   . Hypertension   . Renal insufficiency     Past Gynecological History:  SVD x 3 No LMP recorded. Patient is postmenopausal.  Family Hx:  Family History  Problem Relation Age of Onset  . Headache Mother     Review of Systems:  Constitutional  Feels well,    ENT Normal appearing ears and nares bilaterally Skin/Breast  No rash, sores, jaundice, itching, dryness Cardiovascular  No chest pain, shortness of breath, or edema  Pulmonary  No cough or wheeze.  Gastro Intestinal  No nausea, vomitting, or diarrhoea. No bright red blood per rectum, no abdominal pain, change in bowel movement, or constipation.  Genito Urinary  No frequency, urgency, dysuria, + vaginal bleeding Musculo Skeletal  No myalgia, arthralgia, joint swelling or pain  Neurologic  No weakness, numbness, change in gait,  Psychology  No depression, anxiety, insomnia.   Vitals:  Blood pressure (!) 145/71, pulse (!) 109, temperature 98.6 F (37 C), temperature source Oral, resp. rate 20, weight 143 lb 8 oz (65.1 kg), SpO2 100 %.  Physical Exam: WD in NAD Neck  Supple NROM, without any enlargements.  Lymph Node Survey No cervical supraclavicular or inguinal adenopathy Cardiovascular  Pulse normal rate, regularity and rhythm. S1 and S2 normal.  Lungs  Clear to auscultation bilateraly, without wheezes/crackles/rhonchi. Good air movement.  Skin  No rash/lesions/breakdown  Psychiatry  Alert and oriented to person, place, and time  Abdomen  Normoactive bowel sounds, abdomen soft, non-tender and obese without evidence of hernia. Incision well healed with the  exception ofa pin point area above the umbilicus where there is scant serous drainage - no erythema or palpable collection of fluid. No evidence of cellulitis. Back No CVA tenderness Genito Urinary  Vulva/vagina: vaginal cuff has granuloma vs residual tumor at vaginal apex. No dehiscence, no bleeding. Rectal  deferred Extremities  No bilateral cyanosis, clubbing or edema.   30 minutes of direct face to face counseling time was spent with the patient. This included discussion about prognosis, therapy recommendations and postoperative side effects and are beyond the scope of routine postoperative care.   [name], MD  02/28/2017, 11:47 AM

## 2017-03-02 ENCOUNTER — Telehealth: Payer: Self-pay | Admitting: *Deleted

## 2017-03-02 NOTE — Telephone Encounter (Signed)
Called and spoke with the patient's daughter, explained that the appt for Monday at [facility] was cancelled. Informed the daughter that the center doesn't offer the treatment she needs. Advised the daughter that we are trying to schedule an appt at [facility] and will call them back. Patient's daughter verbalized understanding.

## 2017-03-06 ENCOUNTER — Telehealth: Payer: Self-pay | Admitting: *Deleted

## 2017-03-06 NOTE — Telephone Encounter (Signed)
Called and spoke with the patient's daughter. Gave her the information for her appt. Gave the date, time, doctor's name, address and phone number of the facility. Appt is for March 4th at 3pm with Dr. Beryl Meager at [facility] in [place], [place]. Advised the daughter that Martha Mcdaniel should be able to do both her chemo and radiation there. They will call if they need anything

## 2017-03-14 ENCOUNTER — Telehealth: Payer: Self-pay | Admitting: Oncology

## 2017-03-14 ENCOUNTER — Ambulatory Visit: Payer: Medicare Other

## 2017-03-14 ENCOUNTER — Ambulatory Visit
Admission: RE | Admit: 2017-03-14 | Discharge: 2017-03-14 | Disposition: A | Discharge status: Home / Self Care | Payer: Medicare Other | Source: Ambulatory Visit | Attending: Radiation Oncology | Admitting: Radiation Oncology

## 2017-03-14 NOTE — Telephone Encounter (Signed)
Called patient and asked if patient will be coming for her consult appointment with Dr. Sondra Come today.  Her family said she is going to have treatment at [facility] and would like to cancel the appointment.

## 2017-04-03 ENCOUNTER — Encounter: Payer: Self-pay | Admitting: Gynecologic Oncology

## 2017-04-03 NOTE — Progress Notes (Signed)
Received call from Dr. Darrin Luis in [facility].  He was wanting to let Dr. Denman George know that the radiation oncologist at [facility] was recommending external beam as well with vaginal brachytherapy at the completion of chemotherapy.  He also requested to have the operative notes faxed to him at [phone].  Her phone number is [phone].

## 2017-04-04 ENCOUNTER — Telehealth: Payer: Self-pay | Admitting: *Deleted

## 2017-04-04 NOTE — Telephone Encounter (Signed)
Per request I have faxed the op note, path report and last office visit to Dr. Latanya Presser

## 2018-01-10 ENCOUNTER — Telehealth: Payer: Self-pay | Admitting: *Deleted

## 2018-01-10 NOTE — Telephone Encounter (Signed)
Dr. Francia Greaves from [facility] with [facility] called to speak to Dr. Denman George regarding the patient. He asked the DAr. Denman George calls him tomorrow regarding the patient.

## 2018-07-10 DEATH — deceased

## 2019-07-02 IMAGING — CT CT ABD-PELV W/ CM
3 of 11 series · 11 of 46 positions shown, 16 images · IV contrast (APPLIED)
Comparison: 12/27/2016

CLINICAL DATA: Nausea/vomiting. NGT inserted for ileus and abd
distention. tachycardic. 4 days post op

EXAM:
CT ANGIOGRAPHY CHEST
CT ABDOMEN AND PELVIS WITH CONTRAST
TECHNIQUE: Multidetector CT imaging of the chest was performed using the
standard protocol during bolus administration of intravenous
contrast. Multiplanar CT image reconstructions and MIPs were
obtained to evaluate the vascular anatomy. Multidetector CT imaging
of the abdomen and pelvis was performed using the standard protocol
during bolus administration of intravenous contrast.
CONTRAST:  100mL GMDOER-IJV IOPAMIDOL (GMDOER-IJV) INJECTION 76%

[Series 5: thins · axial · 0.61mm/px · z∈[-247,-49]mm · 7 of 270 slices shown]
[im 18/270  soft-tissue]
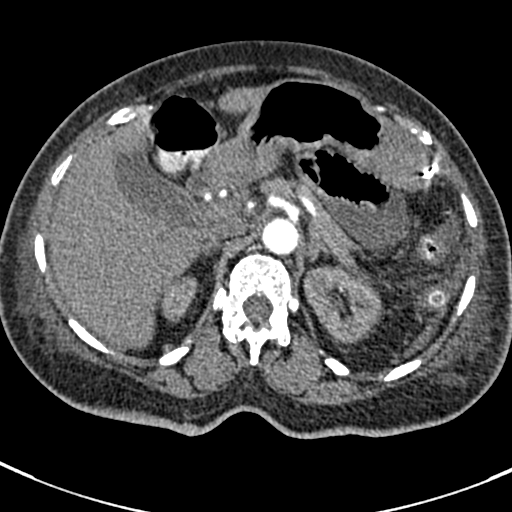
[im 54/270  soft-tissue]
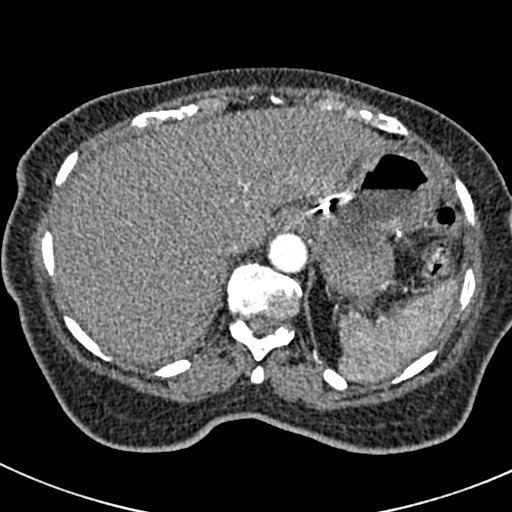
[im 90/270  soft-tissue]
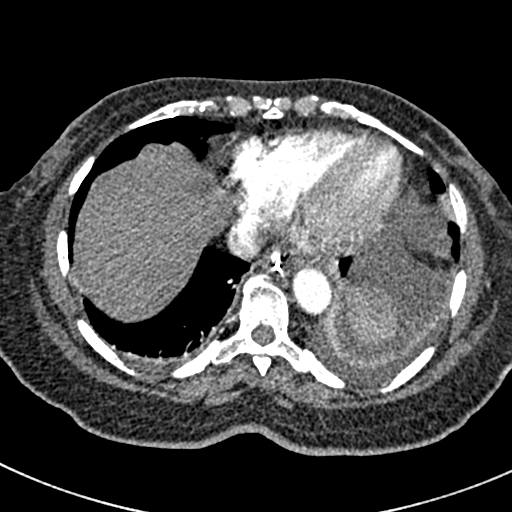
[im 126/270  soft-tissue]
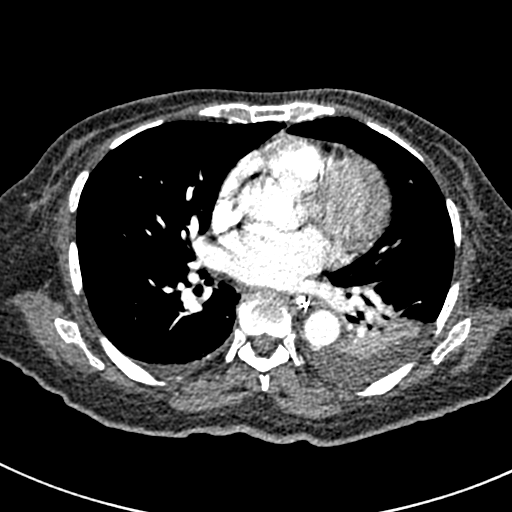
[im 144/270  soft-tissue]
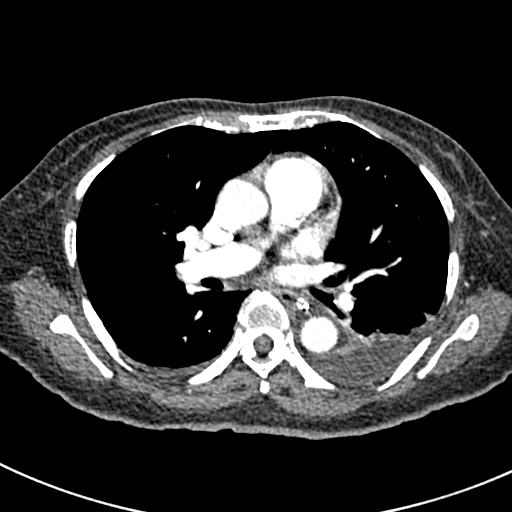
[im 180/270  soft-tissue]
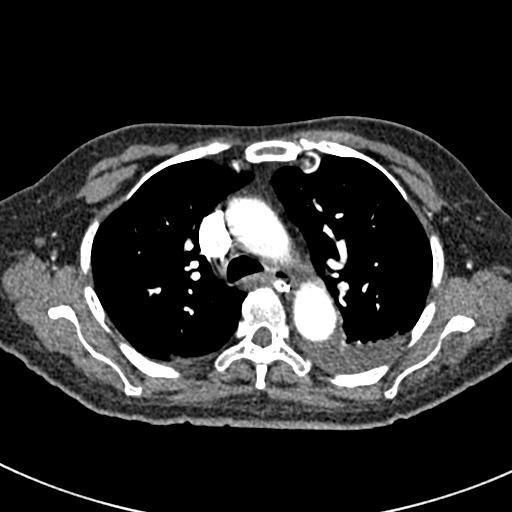
[im 216/270  soft-tissue]
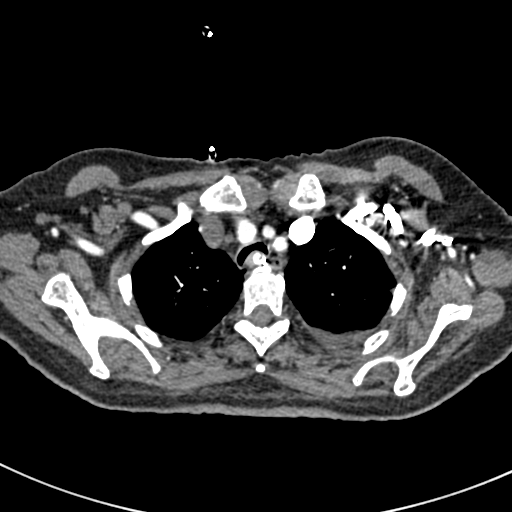

[Series 7: axial st · axial · 0.70mm/px · z∈[-456,-246]mm · 3 of 84 slices shown, 7 images]
[im 21/84  soft-tissue]
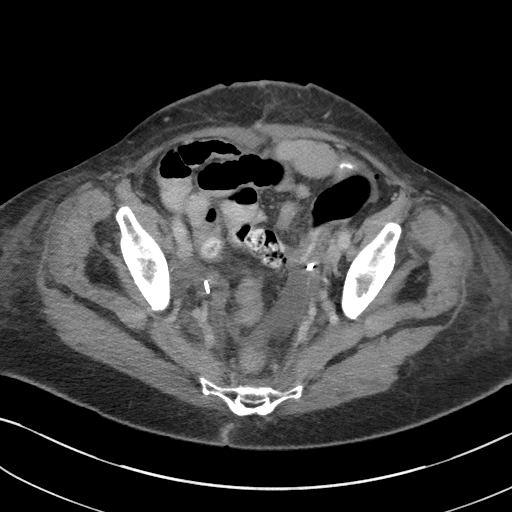
[im 21/84  lung]
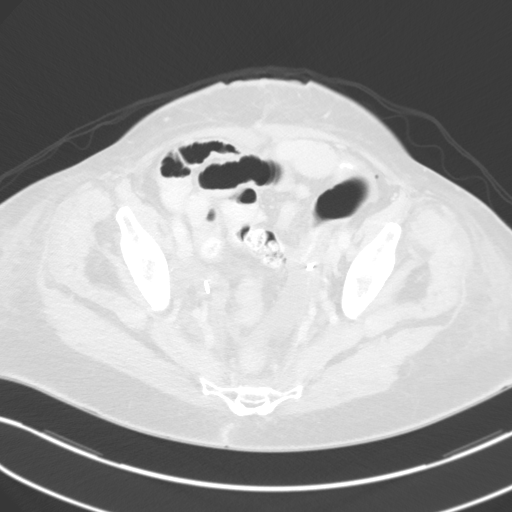
[im 21/84  bone]
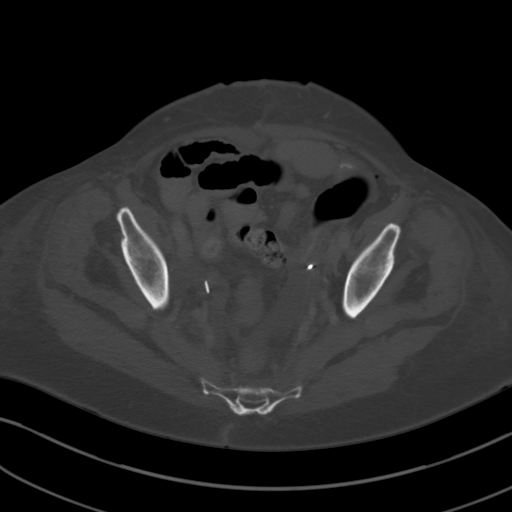
[im 42/84  soft-tissue]
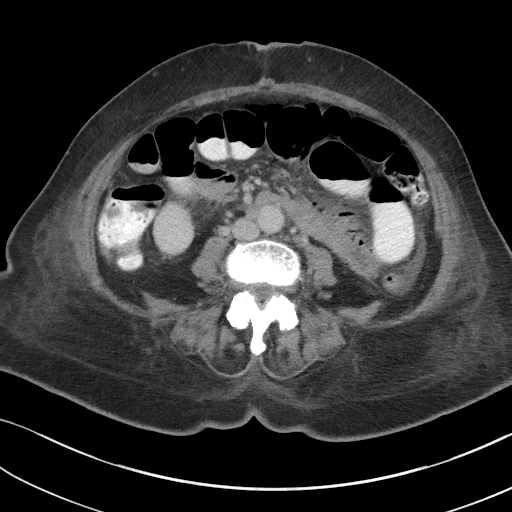
[im 42/84  lung]
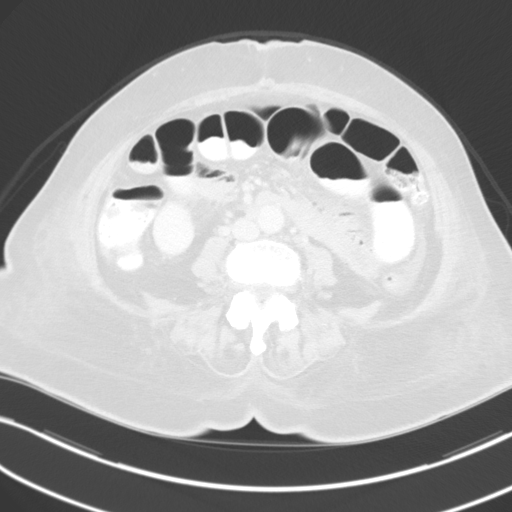
[im 63/84  soft-tissue]
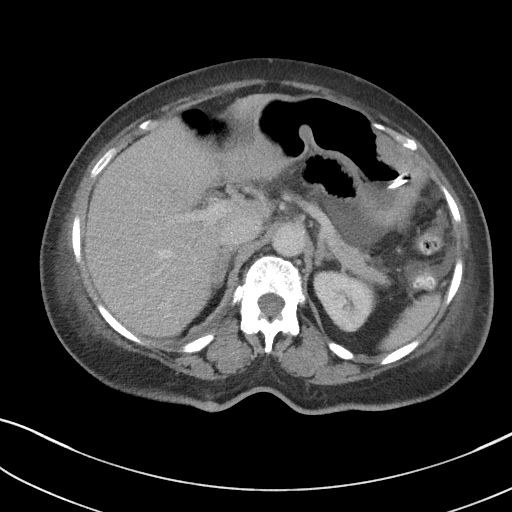
[im 63/84  lung]
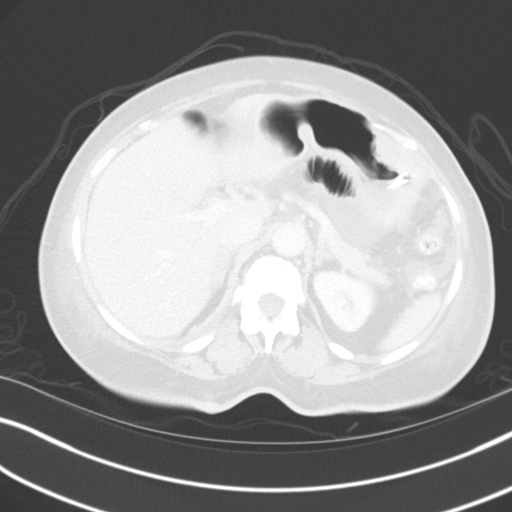

[Series 10: coronal mpr · coronal · 0.52mm/px · 1 of 151 slices shown, 2 images]
[im 76/151  soft-tissue]
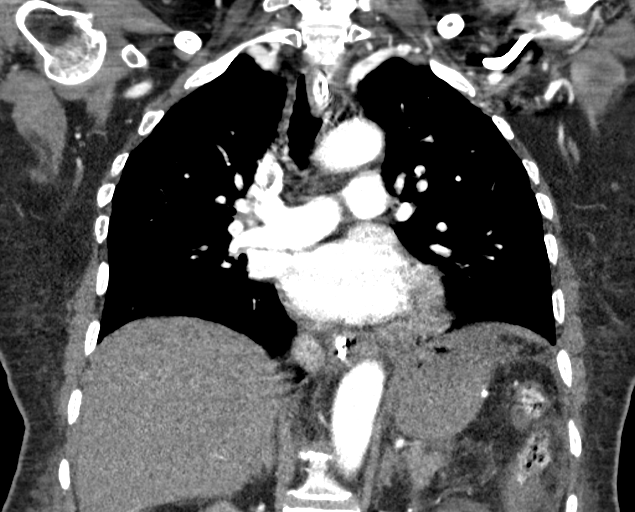
[im 76/151  bone]
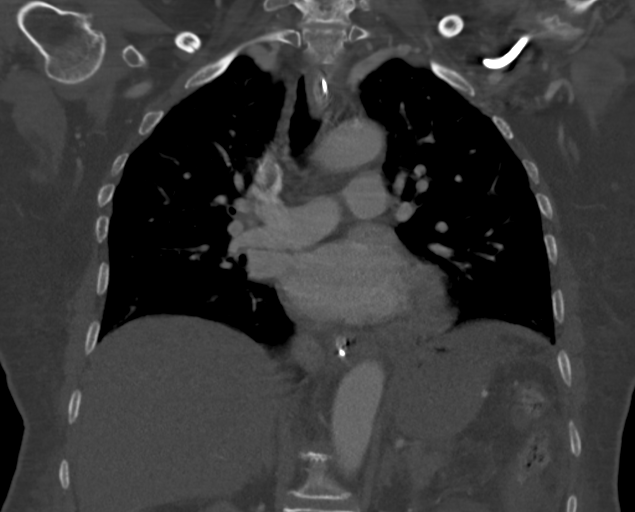

[11 of 46 positions shown; findings below may reference images not displayed]

FINDINGS: CTA CHEST FINDINGS

Cardiovascular: Satisfactory opacification of pulmonary arteries
noted, and there is no evidence of pulmonary emboli. Adequate
contrast opacification of the thoracic aorta with no evidence of
dissection, aneurysm, or stenosis. There is classic 3-vessel
brachiocephalic arch anatomy without proximal stenosis. Scattered
calcified plaque in the distal arch and proximal abdominal aorta.

Mediastinum/Nodes: No enlarged mediastinal, hilar, or axillary lymph
nodes. Thyroid gland, trachea, and esophagus demonstrate no
significant findings.

Lungs/Pleura: Small pleural effusions left greater than right
without evident loculation or pleural enhancement.. Dependent
atelectasis/consolidation in the posterior left lower lobe.

0.8 cm subpleural nodule in the left upper lobe image [DATE].

No pneumothorax.  No pleural nodularity.

Musculoskeletal: Anterior vertebral endplate spurring at multiple
levels in the mid and lower thoracic spine. No fracture or worrisome
bone lesion. Early degenerative spurring in bilateral shoulders.

Review of the MIP images confirms the above findings.

CT ABDOMEN and PELVIS FINDINGS

Hepatobiliary: No focal liver abnormality is seen. No gallstones,
gallbladder wall thickening, or biliary dilatation.

Pancreas: Unremarkable. No pancreatic ductal dilatation or
surrounding inflammatory changes.

Spleen: Normal in size without focal abnormality.

Adrenals/Urinary Tract: Normal adrenal glands. Unremarkable left
kidney. 4.1 cm exophytic cyst from the upper pole right kidney. No
hydronephrosis. Urinary bladder decompressed by Foley catheter.

Stomach/Bowel: Nasogastric tube into the decompressed stomach.
Anastomotic staple line along the greater curvature of the stomach.
Interval resection of the partially calcified gastric mass. There is
dilatation of multiple mid and distal small bowel loops. The distal
most loops of ileum including terminal ileum are decompressed and
unremarkable. There is no discrete transition zone or evident
etiology for the small bowel dilatation. There is mild distention of
the proximal colon, decompressed distally, unremarkable.

Vascular/Lymphatic: Scattered atheromatous calcifications in the
aorta and iliac arteries without aneurysm. No venous anomaly. No
abdominal or pelvic adenopathy localized.

Reproductive: Interval hysterectomy.  No adnexal masses.

Other: Scattered pelvic and upper abdominal ascites without evident
loculation or peripheral enhancement. Tiny scattered peritoneal gas
bubbles in the right upper abdomen and lower pelvis, presumably
related to recent abdominal surgical procedures.

Musculoskeletal: Degenerative disc disease L5-S1. Anterior vertebral
endplate spurring at multiple levels in the lower thoracic spine. No
fracture or worrisome bone lesion.

Review of the MIP images confirms the above findings.
IMPRESSION: 1. Postop changes of interval hysterectomy with scattered pelvic
ascites, no discrete loculated or enhancing collection to suggest
abscess.
2. Dilated small bowel loops without discrete transition point or
evidence of mechanical obstruction, suggesting adynamic ileus.
3. Negative for acute PE or thoracic aortic dissection.
4. Solitary 0.8 cm left upper lobe pulmonary nodule, possibly a
metastatic focus given clinical history. Follow-up recommended.
5. Small bilateral pleural effusions.

## 2019-07-06 IMAGING — DX DG CHEST 2V
2 series · 2 of 2 positions shown · non-contrast
Comparison: Chest CT February 10, 2017

CLINICAL DATA: Cough and fever

EXAM:
CHEST  2 VIEW

[chest lat]
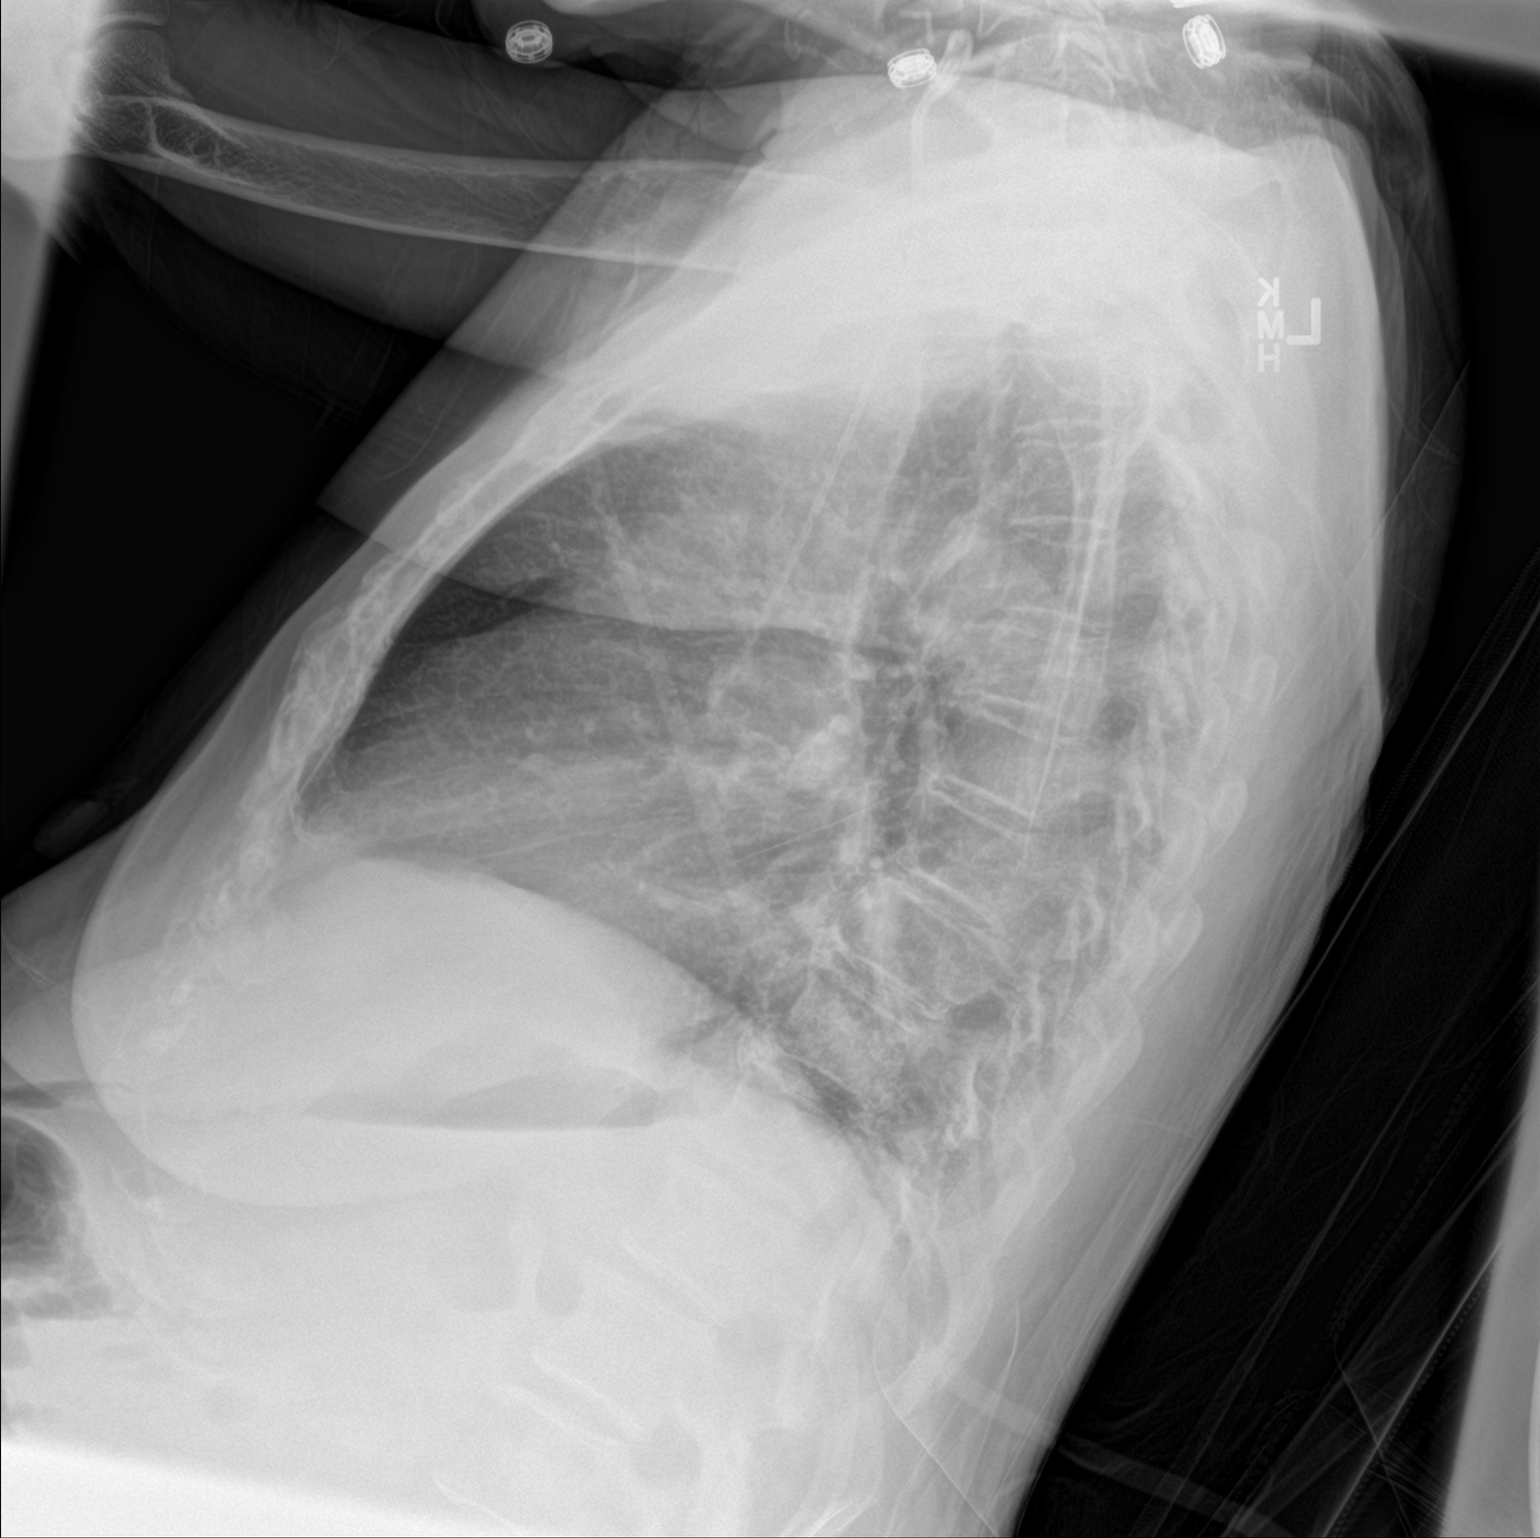

[chest ap]
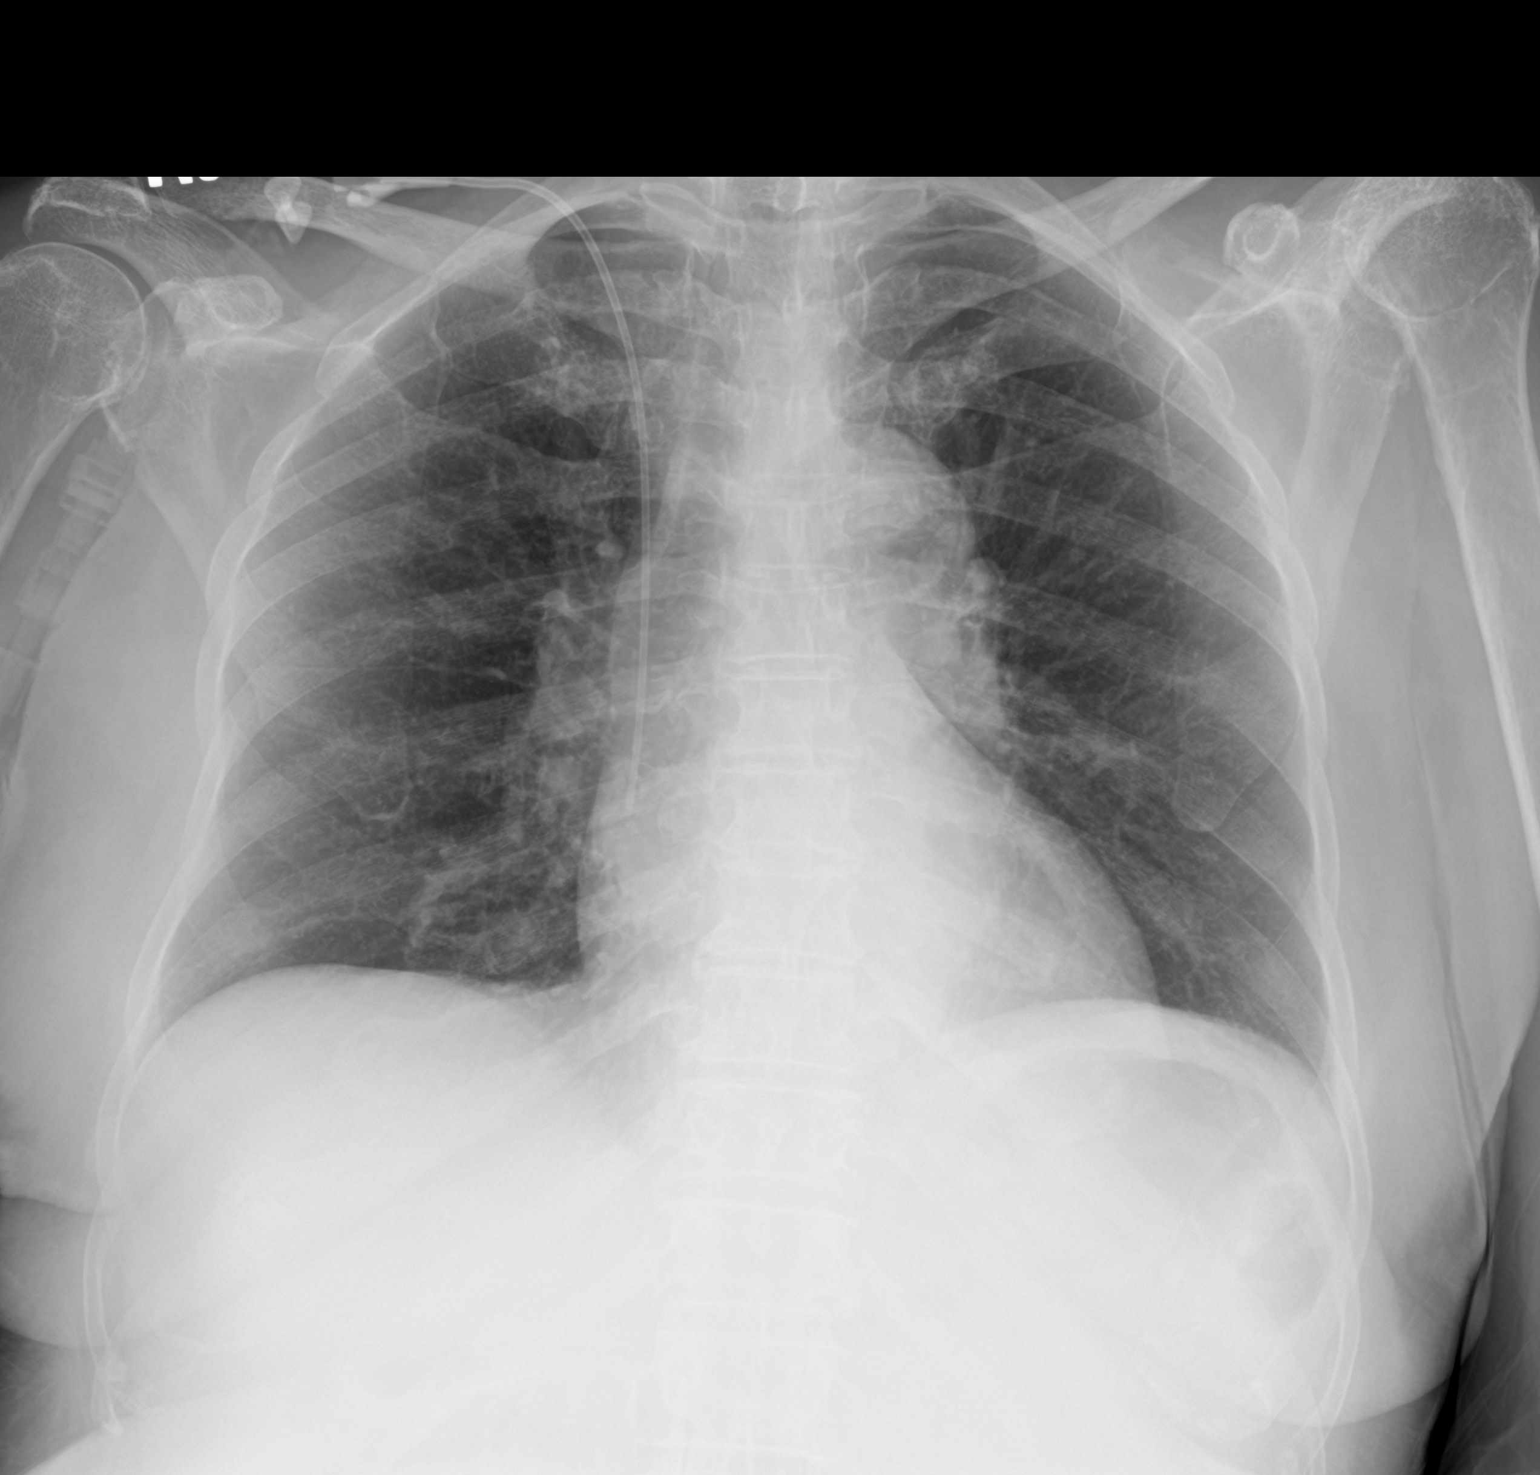

[2 of 2 positions shown; findings below may reference images not displayed]

FINDINGS: Central catheter tip is at the cavoatrial junction. No pneumothorax.
There is no edema or consolidation. The heart size and pulmonary
vascularity are normal. No adenopathy. No bone lesions.
IMPRESSION: No edema or consolidation.  No pneumothorax.
